# Patient Record
Sex: Male | Born: 1960 | Race: White | Hispanic: No | Marital: Married | Smoking: Never smoker
Health system: Southern US, Community
[De-identification: ages and names within clinical notes are randomized; demographics above are authoritative.]

## PROBLEM LIST (undated history)

## (undated) DIAGNOSIS — F259 Schizoaffective disorder, unspecified: Secondary | ICD-10-CM

## (undated) DIAGNOSIS — M19012 Primary osteoarthritis, left shoulder: Secondary | ICD-10-CM

## (undated) DIAGNOSIS — F258 Other schizoaffective disorders: Secondary | ICD-10-CM

## (undated) DIAGNOSIS — K863 Pseudocyst of pancreas: Secondary | ICD-10-CM

## (undated) DIAGNOSIS — I82409 Acute embolism and thrombosis of unspecified deep veins of unspecified lower extremity: Secondary | ICD-10-CM

## (undated) DIAGNOSIS — E785 Hyperlipidemia, unspecified: Secondary | ICD-10-CM

## (undated) DIAGNOSIS — K635 Polyp of colon: Secondary | ICD-10-CM

## (undated) DIAGNOSIS — R339 Retention of urine, unspecified: Secondary | ICD-10-CM

## (undated) DIAGNOSIS — I10 Essential (primary) hypertension: Secondary | ICD-10-CM

## (undated) DIAGNOSIS — N281 Cyst of kidney, acquired: Secondary | ICD-10-CM

## (undated) HISTORY — DX: Polyp of colon: K63.5

## (undated) HISTORY — PX: COLONOSCOPY: SHX174

## (undated) HISTORY — DX: Pseudocyst of pancreas: K86.3

## (undated) HISTORY — DX: Hyperlipidemia, unspecified: E78.5

---
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
## 1998-08-23 HISTORY — PX: LASIK: SHX215

## 2006-07-22 ENCOUNTER — Ambulatory Visit (HOSPITAL_COMMUNITY): Admission: RE | Admit: 2006-07-22 | Discharge: 2006-07-22 | Payer: Self-pay | Admitting: Family Medicine

## 2009-04-23 ENCOUNTER — Ambulatory Visit: Payer: Self-pay | Admitting: Psychiatry

## 2009-04-23 ENCOUNTER — Emergency Department (HOSPITAL_COMMUNITY): Admission: EM | Admit: 2009-04-23 | Discharge: 2009-04-23 | Payer: Self-pay | Admitting: Emergency Medicine

## 2009-04-23 ENCOUNTER — Inpatient Hospital Stay (HOSPITAL_COMMUNITY): Admission: AD | Admit: 2009-04-23 | Discharge: 2009-04-29 | Payer: Self-pay | Admitting: Psychiatry

## 2009-10-08 ENCOUNTER — Ambulatory Visit (HOSPITAL_COMMUNITY): Admission: RE | Admit: 2009-10-08 | Discharge: 2009-10-08 | Payer: Self-pay | Admitting: Gastroenterology

## 2010-09-13 ENCOUNTER — Encounter: Payer: Self-pay | Admitting: Family Medicine

## 2010-11-27 LAB — COMPREHENSIVE METABOLIC PANEL WITH GFR
ALT: 17 U/L (ref 0–53)
AST: 25 U/L (ref 0–37)
Albumin: 4.1 g/dL (ref 3.5–5.2)
Alkaline Phosphatase: 51 U/L (ref 39–117)
BUN: 19 mg/dL (ref 6–23)
CO2: 31 meq/L (ref 19–32)
Calcium: 9.2 mg/dL (ref 8.4–10.5)
Chloride: 101 meq/L (ref 96–112)
Creatinine, Ser: 1.27 mg/dL (ref 0.4–1.5)
GFR calc non Af Amer: >60 mL/min
Glucose, Bld: 100 mg/dL — ABNORMAL HIGH (ref 70–99)
Potassium: 3.7 meq/L (ref 3.5–5.1)
Sodium: 140 meq/L (ref 135–145)
Total Bilirubin: 1.2 mg/dL (ref 0.3–1.2)
Total Protein: 7.3 g/dL (ref 6.0–8.3)

## 2010-11-27 LAB — URINE MICROSCOPIC-ADD ON

## 2010-11-27 LAB — DIFFERENTIAL
Basophils Absolute: 0 K/uL (ref 0.0–0.1)
Basophils Relative: 1 % (ref 0–1)
Eosinophils Absolute: 0 K/uL (ref 0.0–0.7)
Eosinophils Relative: 0 % (ref 0–5)
Lymphocytes Relative: 20 % (ref 12–46)
Lymphs Abs: 1.2 K/uL (ref 0.7–4.0)
Monocytes Absolute: 0.5 K/uL (ref 0.1–1.0)
Monocytes Relative: 9 % (ref 3–12)
Neutro Abs: 4.3 K/uL (ref 1.7–7.7)
Neutrophils Relative %: 71 % (ref 43–77)

## 2010-11-27 LAB — URINALYSIS, ROUTINE W REFLEX MICROSCOPIC
Glucose, UA: NEGATIVE mg/dL
Hgb urine dipstick: NEGATIVE
Ketones, ur: 40 mg/dL — AB
Leukocytes, UA: NEGATIVE
Nitrite: NEGATIVE
Protein, ur: 30 mg/dL — AB
Specific Gravity, Urine: 1.03 (ref 1.005–1.030)
Urobilinogen, UA: 1 mg/dL (ref 0.0–1.0)
pH: 6 (ref 5.0–8.0)

## 2010-11-27 LAB — ETHANOL

## 2010-11-27 LAB — RAPID URINE DRUG SCREEN, HOSP PERFORMED
Amphetamines: NOT DETECTED
Barbiturates: NOT DETECTED
Benzodiazepines: NOT DETECTED
Cocaine: NOT DETECTED
Opiates: NOT DETECTED
Tetrahydrocannabinol: NOT DETECTED

## 2010-11-27 LAB — CBC
HCT: 46.7 % (ref 39.0–52.0)
Hemoglobin: 15.9 g/dL (ref 13.0–17.0)
MCHC: 34.1 g/dL (ref 30.0–36.0)
MCV: 92 fL (ref 78.0–100.0)
RDW: 13.1 % (ref 11.5–15.5)
WBC: 6.1 10*3/uL (ref 4.0–10.5)
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
## 2012-08-23 DIAGNOSIS — K863 Pseudocyst of pancreas: Secondary | ICD-10-CM

## 2012-08-23 HISTORY — DX: Pseudocyst of pancreas: K86.3

## 2012-10-23 ENCOUNTER — Emergency Department (HOSPITAL_COMMUNITY): Payer: Medicare Other

## 2012-10-23 ENCOUNTER — Inpatient Hospital Stay (HOSPITAL_COMMUNITY)
Admission: EM | Admit: 2012-10-23 | Discharge: 2012-10-30 | DRG: 439 | Disposition: A | Discharge status: Home / Self Care | Payer: Medicare Other | Attending: Family Medicine | Admitting: Family Medicine

## 2012-10-23 ENCOUNTER — Encounter (HOSPITAL_COMMUNITY): Payer: Self-pay | Admitting: Family Medicine

## 2012-10-23 DIAGNOSIS — K861 Other chronic pancreatitis: Secondary | ICD-10-CM | POA: Diagnosis present

## 2012-10-23 DIAGNOSIS — I959 Hypotension, unspecified: Secondary | ICD-10-CM

## 2012-10-23 DIAGNOSIS — R571 Hypovolemic shock: Secondary | ICD-10-CM

## 2012-10-23 DIAGNOSIS — E876 Hypokalemia: Secondary | ICD-10-CM | POA: Diagnosis present

## 2012-10-23 DIAGNOSIS — K859 Acute pancreatitis without necrosis or infection, unspecified: Secondary | ICD-10-CM

## 2012-10-23 DIAGNOSIS — N179 Acute kidney failure, unspecified: Secondary | ICD-10-CM

## 2012-10-23 DIAGNOSIS — K560 Paralytic ileus: Secondary | ICD-10-CM | POA: Diagnosis present

## 2012-10-23 DIAGNOSIS — I10 Essential (primary) hypertension: Secondary | ICD-10-CM | POA: Diagnosis present

## 2012-10-23 DIAGNOSIS — E871 Hypo-osmolality and hyponatremia: Secondary | ICD-10-CM

## 2012-10-23 DIAGNOSIS — K8050 Calculus of bile duct without cholangitis or cholecystitis without obstruction: Secondary | ICD-10-CM | POA: Diagnosis present

## 2012-10-23 DIAGNOSIS — K8020 Calculus of gallbladder without cholecystitis without obstruction: Secondary | ICD-10-CM

## 2012-10-23 DIAGNOSIS — R109 Unspecified abdominal pain: Secondary | ICD-10-CM

## 2012-10-23 DIAGNOSIS — F259 Schizoaffective disorder, unspecified: Secondary | ICD-10-CM | POA: Diagnosis present

## 2012-10-23 DIAGNOSIS — K819 Cholecystitis, unspecified: Secondary | ICD-10-CM

## 2012-10-23 HISTORY — DX: Schizoaffective disorder, unspecified: F25.9

## 2012-10-23 HISTORY — DX: Cyst of kidney, acquired: N28.1

## 2012-10-23 HISTORY — DX: Other schizoaffective disorders: F25.8

## 2012-10-23 LAB — URINE MICROSCOPIC-ADD ON

## 2012-10-23 LAB — COMPREHENSIVE METABOLIC PANEL
ALT: 100 U/L — ABNORMAL HIGH (ref 0–53)
AST: 74 U/L — ABNORMAL HIGH (ref 0–37)
Albumin: 3.3 g/dL — ABNORMAL LOW (ref 3.5–5.2)
Chloride: 85 mEq/L — ABNORMAL LOW (ref 96–112)
GFR calc non Af Amer: 18 mL/min — ABNORMAL LOW (ref >90)
Potassium: 4.5 mEq/L (ref 3.5–5.1)
Sodium: 126 mEq/L — ABNORMAL LOW (ref 135–145)
Total Bilirubin: 1.2 mg/dL (ref 0.3–1.2)

## 2012-10-23 LAB — URINALYSIS, ROUTINE W REFLEX MICROSCOPIC
Ketones, ur: NEGATIVE mg/dL
Protein, ur: 100 mg/dL — AB
Urobilinogen, UA: 0.2 mg/dL (ref 0.0–1.0)

## 2012-10-23 LAB — CBC
HCT: 37.4 % — ABNORMAL LOW (ref 39.0–52.0)
MCHC: 34.8 g/dL (ref 30.0–36.0)
Platelets: 203 10*3/uL (ref 150–400)
RDW: 13.2 % (ref 11.5–15.5)
WBC: 13 10*3/uL — ABNORMAL HIGH (ref 4.0–10.5)

## 2012-10-23 LAB — POCT I-STAT 3, ART BLOOD GAS (G3+)
Acid-base deficit: 2 mmol/L (ref 0.0–2.0)
O2 Saturation: 93 %
TCO2: 24 mmol/L (ref 0–100)
pO2, Arterial: 68 mmHg — ABNORMAL LOW (ref 80.0–100.0)

## 2012-10-23 LAB — BASIC METABOLIC PANEL
BUN: 49 mg/dL — ABNORMAL HIGH (ref 6–23)
Calcium: 7.4 mg/dL — ABNORMAL LOW (ref 8.4–10.5)
Creatinine, Ser: 2.32 mg/dL — ABNORMAL HIGH (ref 0.50–1.35)
GFR calc Af Amer: 35 mL/min — ABNORMAL LOW (ref >90)
GFR calc non Af Amer: 30 mL/min — ABNORMAL LOW (ref >90)

## 2012-10-23 LAB — CBC WITH DIFFERENTIAL/PLATELET
Basophils Absolute: 0 10*3/uL (ref 0.0–0.1)
Basophils Relative: 0 % (ref 0–1)
HCT: 44.8 % (ref 39.0–52.0)
Lymphocytes Relative: 3 % — ABNORMAL LOW (ref 12–46)
MCV: 87.8 fL (ref 78.0–100.0)
Monocytes Relative: 10 % (ref 3–12)
Neutro Abs: 21.3 10*3/uL — ABNORMAL HIGH (ref 1.7–7.7)
Neutrophils Relative %: 87 % — ABNORMAL HIGH (ref 43–77)
Platelets: 268 10*3/uL (ref 150–400)
RDW: 13.1 % (ref 11.5–15.5)
WBC: 24.5 10*3/uL — ABNORMAL HIGH (ref 4.0–10.5)

## 2012-10-23 LAB — POCT I-STAT 3, VENOUS BLOOD GAS (G3P V)
Acid-base deficit: 1 mmol/L (ref 0.0–2.0)
Bicarbonate: 22.9 mEq/L (ref 20.0–24.0)
O2 Saturation: 99 %
Patient temperature: 97.3
TCO2: 24 mmol/L (ref 0–100)
pH, Ven: 7.411 — ABNORMAL HIGH (ref 7.250–7.300)

## 2012-10-23 LAB — CG4 I-STAT (LACTIC ACID): Lactic Acid, Venous: 5.26 mmol/L — ABNORMAL HIGH (ref 0.5–2.2)

## 2012-10-23 MED ORDER — HEPARIN SODIUM (PORCINE) 5000 UNIT/ML IJ SOLN
5000.0000 [IU] | Freq: Three times a day (TID) | INTRAMUSCULAR | Status: DC
  Administered 2012-10-23 – 2012-10-28 (×13): 5000 [IU] via SUBCUTANEOUS
  Filled 2012-10-23 (×23): qty 1

## 2012-10-23 MED ORDER — SODIUM CHLORIDE 0.9 % IJ SOLN
3.0000 mL | Freq: Two times a day (BID) | INTRAMUSCULAR | Status: DC
  Administered 2012-10-23 – 2012-10-30 (×4): 3 mL via INTRAVENOUS

## 2012-10-23 MED ORDER — QUETIAPINE FUMARATE 200 MG PO TABS
400.0000 mg | ORAL_TABLET | Freq: Every evening | ORAL | Status: DC | PRN
  Administered 2012-10-23 – 2012-10-29 (×5): 400 mg via ORAL
  Filled 2012-10-23 (×2): qty 2
  Filled 2012-10-23 (×3): qty 1
  Filled 2012-10-23: qty 2

## 2012-10-23 MED ORDER — ONDANSETRON HCL 4 MG/2ML IJ SOLN
4.0000 mg | Freq: Once | INTRAMUSCULAR | Status: AC
  Administered 2012-10-23: 4 mg via INTRAVENOUS
  Filled 2012-10-23: qty 2

## 2012-10-23 MED ORDER — ONDANSETRON HCL 4 MG PO TABS: 4.0000 mg | ORAL_TABLET | Freq: Four times a day (QID) | ORAL | Status: DC | PRN

## 2012-10-23 MED ORDER — SODIUM CHLORIDE 0.9 % IV SOLN: 3.0000 g | Freq: Once | INTRAVENOUS | Status: DC

## 2012-10-23 MED ORDER — KCL IN DEXTROSE-NACL 10-5-0.45 MEQ/L-%-% IV SOLN
INTRAVENOUS | Status: DC
  Administered 2012-10-23 – 2012-10-24 (×2): via INTRAVENOUS
  Filled 2012-10-23 (×5): qty 1000

## 2012-10-23 MED ORDER — MORPHINE SULFATE 4 MG/ML IJ SOLN
4.0000 mg | Freq: Once | INTRAMUSCULAR | Status: AC
  Administered 2012-10-23: 4 mg via INTRAVENOUS
  Filled 2012-10-23: qty 1

## 2012-10-23 MED ORDER — SODIUM CHLORIDE 0.9 % IV SOLN
3.0000 g | Freq: Two times a day (BID) | INTRAVENOUS | Status: DC
  Administered 2012-10-24: 3 g via INTRAVENOUS
  Filled 2012-10-23 (×2): qty 3

## 2012-10-23 MED ORDER — ONDANSETRON HCL 4 MG/2ML IJ SOLN: 4.0000 mg | Freq: Four times a day (QID) | INTRAMUSCULAR | Status: DC | PRN

## 2012-10-23 MED ORDER — QUETIAPINE FUMARATE 400 MG PO TABS
400.0000 mg | ORAL_TABLET | Freq: Every day | ORAL | Status: DC
  Administered 2012-10-23 – 2012-10-29 (×7): 400 mg via ORAL
  Filled 2012-10-23 (×9): qty 1

## 2012-10-23 MED ORDER — QUETIAPINE FUMARATE 400 MG PO TABS: 400.0000 mg | ORAL_TABLET | Freq: Every day | ORAL | Status: DC

## 2012-10-23 MED ORDER — SODIUM CHLORIDE 0.9 % IV SOLN
3.0000 g | Freq: Once | INTRAVENOUS | Status: AC
  Administered 2012-10-23: 3 g via INTRAVENOUS
  Filled 2012-10-23: qty 3

## 2012-10-23 MED ORDER — PANTOPRAZOLE SODIUM 40 MG IV SOLR
40.0000 mg | INTRAVENOUS | Status: DC
  Administered 2012-10-23 – 2012-10-26 (×4): 40 mg via INTRAVENOUS
  Filled 2012-10-23 (×6): qty 40

## 2012-10-23 MED ORDER — SODIUM CHLORIDE 0.9 % IV SOLN
Freq: Once | INTRAVENOUS | Status: AC
  Administered 2012-10-23: 12:00:00 via INTRAVENOUS

## 2012-10-23 MED ORDER — SODIUM CHLORIDE 0.9 % IV BOLUS (SEPSIS)
1000.0000 mL | Freq: Once | INTRAVENOUS | Status: AC
  Administered 2012-10-23: 1000 mL via INTRAVENOUS

## 2012-10-23 MED ORDER — HYDROMORPHONE HCL PF 1 MG/ML IJ SOLN
1.0000 mg | INTRAMUSCULAR | Status: DC | PRN
  Administered 2012-10-23 – 2012-10-26 (×8): 1 mg via INTRAVENOUS
  Filled 2012-10-23 (×7): qty 1

## 2012-10-23 MED ORDER — IOHEXOL 300 MG/ML  SOLN
25.0000 mL | INTRAMUSCULAR | Status: AC
  Administered 2012-10-23 (×2): 25 mL via ORAL

## 2012-10-23 NOTE — Progress Notes (Signed)
PCP updated in EPIC to [facility] as listed in consult note

## 2012-10-23 NOTE — H&P (Signed)
History and Physical Note Family Medicine Teaching Service Steve M. Hairford, MD Service Pager: [phone]  Steve Levy is an 52 y.o. male.    Chief Complaint: Abdominal pain HPI: Pt is a 52 yo healthy M with a PMH of schizoaffective disorder and renal cysts presenting to ED with a 4 day history of RUQ abd pain, N/V and anorexia. He states he feels like he had a viral infection with N/V but the pain was persistent so he came to ED for evaluation today. Pt states he was diagnosed with cholecystitis 15 years ago but elected to not have gallbladder removed at that time, and he has intermittent RUQ pain since then, but today's episode was worse and he was no longer passing gas or able to have a bowel movement. At initial evaluation in the ED he was hypotensive to the 80's and tachycardic to 120's. His initial labs showed a lactic acidosis to 5.26 and leukocytosis to 24.5. Lipase elevated to 204, as well as AKI with Creat of 3.5. Plain abd X-ray was suggestive of partial SBO.  He subsequently had an abdominal ultrasound that showed sludge in the gall bladder therefore surgery was consulted who felt this could be managed medically until pancreatitis improves. Patient was hydrated and treated with pain medication and anti-emetics, and lactate improved to 2. FPTS called for admission  Past Medical History  Diagnosis Date  . Chronic schizoaffective disorder   . Renal cyst     No past surgical history on file.  No family history on file. Social History:  has no tobacco, alcohol, and drug history on file.  Allergies: No Known Allergies  Prior to Admission medications   Medication Sig Start Date End Date Taking? Authorizing Provider  lisinopril (PRINIVIL,ZESTRIL) 10 MG tablet Take 10 mg by mouth daily.   Yes Historical Provider, MD  QUEtiapine (SEROQUEL) 400 MG tablet Take 400-800 mg by mouth at bedtime.   Yes Historical Provider, MD    Results for orders placed during the hospital encounter of  10/23/12 (from the past 48 hour(s))  COMPREHENSIVE METABOLIC PANEL     Status: Abnormal   Collection Time    10/23/12 10:03 AM      Result Value Range   Sodium 126 (*) 135 - 145 mEq/L   Potassium 4.5  3.5 - 5.1 mEq/L   Chloride 85 (*) 96 - 112 mEq/L   CO2 19  19 - 32 mEq/L   Glucose, Bld 140 (*) 70 - 99 mg/dL   BUN 56 (*) 6 - 23 mg/dL   Creatinine, Ser 2.95 (*) 0.50 - 1.35 mg/dL   Calcium 9.1  8.4 - 62.1 mg/dL   Total Protein 8.0  6.0 - 8.3 g/dL   Albumin 3.3 (*) 3.5 - 5.2 g/dL   AST 74 (*) 0 - 37 U/L   ALT 100 (*) 0 - 53 U/L   Alkaline Phosphatase 99  39 - 117 U/L   Total Bilirubin 1.2  0.3 - 1.2 mg/dL   GFR calc non Af Amer 18 (*) >90 mL/min   GFR calc Af Amer 20 (*) >90 mL/min   Comment:            The eGFR has been calculated     using the CKD EPI equation.     This calculation has not been     validated in all clinical     situations.     eGFR's persistently     <90 mL/min signify  possible Chronic Kidney Disease.  LIPASE, BLOOD     Status: Abnormal   Collection Time    10/23/12 10:03 AM      Result Value Range   Lipase 205 (*) 11 - 59 U/L  CBC WITH DIFFERENTIAL     Status: Abnormal   Collection Time    10/23/12 10:03 AM      Result Value Range   WBC 24.5 (*) 4.0 - 10.5 K/uL   RBC 5.10  4.22 - 5.81 MIL/uL   Hemoglobin 15.3  13.0 - 17.0 g/dL   HCT 40.9  81.1 - 91.4 %   MCV 87.8  78.0 - 100.0 fL   MCH 30.0  26.0 - 34.0 pg   MCHC 34.2  30.0 - 36.0 g/dL   RDW 78.2  95.6 - 21.3 %   Platelets 268  150 - 400 K/uL   Neutrophils Relative 87 (*) 43 - 77 %   Neutro Abs 21.3 (*) 1.7 - 7.7 K/uL   Lymphocytes Relative 3 (*) 12 - 46 %   Lymphs Abs 0.7  0.7 - 4.0 K/uL   Monocytes Relative 10  3 - 12 %   Monocytes Absolute 2.5 (*) 0.1 - 1.0 K/uL   Eosinophils Relative 0  0 - 5 %   Eosinophils Absolute 0.0  0.0 - 0.7 K/uL   Basophils Relative 0  0 - 1 %   Basophils Absolute 0.0  0.0 - 0.1 K/uL  CG4 I-STAT (LACTIC ACID)     Status: Abnormal   Collection Time     10/23/12 10:29 AM      Result Value Range   Lactic Acid, Venous 5.26 (*) 0.5 - 2.2 mmol/L  POCT I-STAT 3, BLOOD GAS (G3+)     Status: Abnormal   Collection Time    10/23/12 12:24 PM      Result Value Range   pH, Arterial 7.395  7.350 - 7.450   pCO2 arterial 37.1  35.0 - 45.0 mmHg   pO2, Arterial 68.0 (*) 80.0 - 100.0 mmHg   Bicarbonate 22.7  20.0 - 24.0 mEq/L   TCO2 24  0 - 100 mmol/L   O2 Saturation 93.0     Acid-base deficit 2.0  0.0 - 2.0 mmol/L   Patient temperature 98.6 F     Collection site RADIAL, ALLEN'S TEST ACCEPTABLE     Drawn by Operator     Sample type ARTERIAL    CG4 I-STAT (LACTIC ACID)     Status: None   Collection Time    10/23/12  1:11 PM      Result Value Range   Lactic Acid, Venous 2.02  0.5 - 2.2 mmol/L  LACTATE DEHYDROGENASE     Status: Abnormal   Collection Time    10/23/12  2:42 PM      Result Value Range   LDH 547 (*) 94 - 250 U/L  URINALYSIS, ROUTINE W REFLEX MICROSCOPIC     Status: Abnormal   Collection Time    10/23/12  3:14 PM      Result Value Range   Color, Urine YELLOW  YELLOW   APPearance HAZY (*) CLEAR   Specific Gravity, Urine 1.022  1.005 - 1.030   pH 5.5  5.0 - 8.0   Glucose, UA 100 (*) NEGATIVE mg/dL   Hgb urine dipstick LARGE (*) NEGATIVE   Bilirubin Urine NEGATIVE  NEGATIVE   Ketones, ur NEGATIVE  NEGATIVE mg/dL   Protein, ur 086 (*) NEGATIVE mg/dL   Urobilinogen, UA  0.2  0.0 - 1.0 mg/dL   Nitrite NEGATIVE  NEGATIVE   Leukocytes, UA NEGATIVE  NEGATIVE  URINE MICROSCOPIC-ADD ON     Status: Abnormal   Collection Time    10/23/12  3:14 PM      Result Value Range   Squamous Epithelial / LPF RARE  RARE   WBC, UA 0-2  <3 WBC/hpf   Bacteria, UA FEW (*) RARE   Casts HYALINE CASTS (*) NEGATIVE   Comment: GRANULAR CAST   Urine-Other MUCOUS PRESENT    POCT I-STAT 3, BLOOD GAS (G3P V)     Status: Abnormal   Collection Time    10/23/12  5:16 PM      Result Value Range   pH, Ven 7.411 (*) 7.250 - 7.300   pCO2, Ven 35.8 (*) 45.0 -  50.0 mmHg   pO2, Ven 149.0 (*) 30.0 - 45.0 mmHg   Bicarbonate 22.9  20.0 - 24.0 mEq/L   TCO2 24  0 - 100 mmol/L   O2 Saturation 99.0     Acid-base deficit 1.0  0.0 - 2.0 mmol/L   Patient temperature 97.3 F     Sample type VENOUS     Ct Abdomen Pelvis Wo Contrast  10/23/2012  *RADIOLOGY REPORT*  Clinical Data: Abdominal pain.  CT ABDOMEN AND PELVIS WITHOUT CONTRAST  Technique:  Multidetector CT imaging of the abdomen and pelvis was performed following the standard protocol without intravenous contrast.  Comparison: Abdominal ultrasound 10/24/2011 the CT abdomen pelvis 07/22/2006  Findings: Bibasilar airspace disease is worse on the left.  Small left pleural effusion is present.  A small pericardial effusion is noted. The heart size is normal.  An 11 mm cyst near the dome of the liver is stable.  A smaller cyst in the left lobe is also stable.  The liver is otherwise unremarkable.  The spleen is within normal limits.  The stomach is unremarkable.  The duodenum is within normal limits.  Extensive inflammatory changes and fluid are noted about the tail the pancreas.  Extensive free fluid is present in the abdomen.  There is no significant free air.  The gallbladder is contracted.  The common bile duct is within normal limits.  The adrenal glands are normal bilaterally.  The right kidney and ureter are within normal limits. A subtle hypodense lesion is present anteriorly in the left kidney with calcification along the inferior aspect of the lesion. This corresponds to the previously noted cyst.  No other definite renal lesions are present.  There is some inflammatory change about the left kidney.  The ureter is within normal limits.  Extensive inflammatory change in free fluid is noted along the pararenal fascia.  No discrete abscess or pseudocyst is definitively identified. Pseudocysts are suspected.  The study is limited without IV contrast.  Contrast was not given secondary to acute renal failure.  There is  diffuse distention of the small bowel with secondary inflammatory changes occurring in the proximal jejunum.  Secondary inflammatory changes are also present in the descending colon and splenic flexure.  There is fluid in the ascending colon.  The appendix is visualized and within normal limits.  The urinary bladder and prostate are within normal limits.  The bone windows demonstrate no focal lytic or blastic lesions.  IMPRESSION:  1.  Acute pancreatitis with extensive retroperitoneal fluid and inflammatory change.  Pseudocyst are suspected but not clearly characterized without IV contrast. 2.  Secondary inflammation of the proximal jejunum and descending colon resulting in  a diffuse ileus. 3.  Left greater than right basilar airspace disease.  While this may represent atelectasis, infection is not excluded. 4.  Small left pleural effusion. 5.  Small pericardial effusion. 6.  Cystic lesions of the liver are stable. 7.  Stable cystic lesion anteriorly in the left kidney.   Original Report Authenticated By: Marin Roberts, M.D.    US Abdomen Complete  10/23/2012  *RADIOLOGY REPORT*  Clinical Data:  Abdominal pain.  ABDOMEN ULTRASOUND  Technique:  Complete abdominal ultrasound examination was performed including evaluation of the liver, gallbladder, bile ducts, pancreas, kidneys, spleen, IVC, and abdominal aorta.  Comparison:  CT on 07/22/2006.  Findings:  Gallbladder:  The gallbladder appears contracted and likely contains some sludge as well as probable shadowing calculi.  No sonographic Murphy's sign was elicited.  There is no evidence of pericholecystic fluid.  Mild prominence of gallbladder wall thickness is likely related to contraction.  Common Bile Duct:  Normal caliber of 4 mm.  Liver:  The liver shows heterogeneous echotexture and no evidence of mass or biliary ductal dilatation.  IVC:  Patent throughout its visualized course in the abdomen.  Pancreas:  The pancreas is completely obscured by bowel gas.   Spleen:  The spleen is of normal echotexture and size.  Kidneys:  The right kidney measures approximately 10.4 cm and the left kidney 11.6 cm.  2.1 cm left renal cyst identified which shows suggestion of mild internal septation.  This cyst is stable in size compared to the prior CT in 2007.  No evidence of hydronephrosis.  Abdominal Aorta:  The abdominal aorta is completely obscured by bowel gas.  IMPRESSION: Contracted gallbladder likely containing sludge and some calculi. There is no evidence of biliary obstruction.   Original Report Authenticated By: Irish Lack, M.D.    Dg Abd Acute W/chest  10/23/2012  *RADIOLOGY REPORT*  Clinical Data: Abdominal pain  ACUTE ABDOMEN SERIES (ABDOMEN 2 VIEW & CHEST 1 VIEW)  Comparison: 07/22/2006  Findings: There is no free air.  There are abnormal dilated fluid and air filled loops of small intestine suggesting partial small bowel obstruction.  No abnormal calcifications or bony findings.  One-view chest shows normal heart and mediastinal shadows.  There is mild atelectasis at the left base.  No free air.  IMPRESSION: Abnormal fluid and air filled loops of small intestine in the central abdomen suggesting partial small bowel obstruction.   Original Report Authenticated By: Paulina Fusi, M.D.     Review of Systems  Constitutional: Positive for malaise/fatigue. Negative for fever and chills.  Respiratory: Negative for shortness of breath.   Cardiovascular: Negative for chest pain.  Gastrointestinal: Positive for nausea, vomiting and abdominal pain. Negative for diarrhea and constipation.  Genitourinary: Negative for dysuria.  Musculoskeletal: Negative for back pain.  Skin: Negative for rash.  Neurological: Negative for headaches.  Psychiatric/Behavioral: Negative for depression and suicidal ideas. The patient is not nervous/anxious.     Blood pressure 135/78, pulse 100, temperature 97.3 F (36.3 C), resp. rate 18, SpO2 9.00%. Physical Exam  Constitutional:  He is oriented to person, place, and time. He appears well-developed and well-nourished. No distress.  HENT:  Head: Normocephalic and atraumatic.  Eyes: Pupils are equal, round, and reactive to light.  Neck: Normal range of motion. Neck supple.  Cardiovascular: Regular rhythm and intact distal pulses.   No murmur heard. Tachycardic  Respiratory: Effort normal and breath sounds normal. He has no wheezes.  GI: Normal appearance. He exhibits distension. He exhibits no  mass. Bowel sounds are decreased. There is tenderness in the right upper quadrant and epigastric area. There is guarding and positive Murphy's sign. There is no CVA tenderness.  Musculoskeletal: Normal range of motion. He exhibits no edema and no tenderness.  Lymphadenopathy:    He has no cervical adenopathy.  Neurological: He is alert and oriented to person, place, and time.  Skin: Skin is warm and dry. No rash noted. He is not diaphoretic.  Psychiatric: He has a normal mood and affect. His behavior is normal.     Assessment/Plan 52 yo M with abdominal pain, N/V who was found to have pancreatitis, cholecystitis, AKI and small bowel ileus   # Pancreatitis- Patient with recurrent pancreatitis. Lipase 205 at admission. Pt denies diabetes, alcohol, prior surgery, abdominal trauma or any new medications/drug use. Could be secondary to biliary source. - Admit to telemetry, attending Dr. Leveda Anna - Keep NPO - Place NGT for distention, vomiting and pain. If not tolerated well, can consider d/c - Continue fluids. He had 3L in the ED, continue D51/2NS + Kcl at 150 cc/hr. Closely monitor I/O and BP, and consider additional NS bolus if needed - Recheck Bmet this evening, and Cmet in the AM - Dilaudid as needed for pain - Zofran for nausea - CT scan has been completed now which also shows pancreatitis, with possible pseudocyst - Surgery consulted in ED, and will follow along - Continue Unasyn for now given the extensive amount of  inflammation and possible ascending cholangitis in the differential. Will d/c in 48-72 hours if patient improves clinically  # AKI- Pt states he is not aware of every having renal failure in the past, but does have a history of renal cysts which was seen on CT scan today - Cr 3.5 at admission - Continue to hydrate - Bmet this evening and repeat labs in the AM - Parameters for I/O placed. Please call if decreased UOP  # Small bowel ileus- differential includes SBO, but given pancreatitis, ileus is more likely - Continue NGT as tolerated - Surgery following - Monitor for passing gas or bowel movements  # HTN- On Lisinopril at home. Hypotensive on arrival, now improved with fluids - Hold Lisinopril tonight, but restart tomorrow if BP remains stable  # Schizoaffective disorder- Stable at this time. Has been hospitalized in the past - Continue Seroquel qhs - Mood and behavior appropriate  FEN/GI- NPO, IVF per above PPX- Protonix IV, Heparin, out of bed as tolerated Dispo- Pending improvement of clinical symptoms as well as surgical evaluation for possible cholecystectomy. Patient will need to be tolerating PO, have his pain controlled and be cleared by surgery before discharge.    Levy, Steve 10/23/2012, 5:43 PM

## 2012-10-23 NOTE — ED Provider Notes (Addendum)
History     CSN: [id number]  Arrival date & time 10/23/12  2130   First MD Initiated Contact with Patient 10/23/12 [phone]      No chief complaint on file.   (Consider location/radiation/quality/duration/timing/severity/associated sxs/prior treatment) HPI Comments: 52 yo male with history of biliary sludge and pancreatitis 15 years prior to presenting to the ED today with epigastric/RUQ abdominal pain, nausea, and vomiting.  Symptoms began Friday (10/20/12) suddenly around 8 am after breakfast.  Since then has had persistent sharp pain in epigastric and RUQ that radiates to the back, nausea, and vomiting.  Saw PCP Friday afternoon and was prescribed hydrocodone and promethazine suppository.  These relieve the symptoms temporarily.  Last dose was this morning at 9am and reduced pain to a 7/10 from a 10/10.  Lying on his belly makes the pain worse.  He reports having no BM or passing gas since Thursday, but has noticed a lot of belching.  Denies fever, chills, hematemesis, hematochezia, melena, urinary changes, chest pain, or shortness of breath.    The history is provided by the patient.    No past medical history on file.  No past surgical history on file.  No family history on file.  History  Substance Use Topics  . Smoking status: Not on file  . Smokeless tobacco: Not on file  . Alcohol Use: Not on file      Review of Systems  Constitutional: Positive for fatigue. Negative for fever, chills and activity change.  HENT: Negative for neck pain.   Eyes: Negative for visual disturbance.  Respiratory: Negative for cough, chest tightness and shortness of breath.   Cardiovascular: Negative for chest pain.  Gastrointestinal: Positive for nausea, vomiting, abdominal pain, constipation and abdominal distention.  Genitourinary: Negative for dysuria, enuresis and difficulty urinating.  Musculoskeletal: Negative for arthralgias.  Neurological: Positive for dizziness and light-headedness.  Negative for syncope and headaches.  Psychiatric/Behavioral: Negative for confusion.    Allergies  Review of patient's allergies indicates no known allergies.  Home Medications   Current Outpatient Rx  Name  Route  Sig  Dispense  Refill  . lisinopril (PRINIVIL,ZESTRIL) 10 MG tablet   Oral   Take 10 mg by mouth daily.         . QUEtiapine (SEROQUEL) 400 MG tablet   Oral   Take 400-800 mg by mouth at bedtime.           BP 85/35  Pulse 126  Temp(Src) 97.3 F (36.3 C)  Resp 16  SpO2 100%  Physical Exam  Nursing note and vitals reviewed. Constitutional: He is oriented to person, place, and time. He appears well-developed.  HENT:  Head: Normocephalic and atraumatic.  Eyes: Conjunctivae and EOM are normal. Pupils are equal, round, and reactive to light.  Neck: Normal range of motion. Neck supple.  Cardiovascular: Normal rate, regular rhythm and normal heart sounds.   Pulmonary/Chest: Effort normal and breath sounds normal. No respiratory distress. He has no wheezes.  Abdominal: Soft. Bowel sounds are normal. He exhibits distension. There is tenderness. There is guarding. There is no rebound.  Epigastric and RUQ tenderness, with some epigastric guarding but no rebound  Neurological: He is alert and oriented to person, place, and time.  Skin: Skin is warm.    ED Course  Procedures (including critical care time)  Labs Reviewed  CBC WITH DIFFERENTIAL - Abnormal; Notable for the following:    WBC 24.5 (*)    Neutrophils Relative 87 (*)    Neutro  Abs 21.3 (*)    Lymphocytes Relative 3 (*)    Monocytes Absolute 2.5 (*)    All other components within normal limits  CG4 I-STAT (LACTIC ACID) - Abnormal; Notable for the following:    Lactic Acid, Venous 5.26 (*)    All other components within normal limits  COMPREHENSIVE METABOLIC PANEL  LIPASE, BLOOD  URINALYSIS, ROUTINE W REFLEX MICROSCOPIC   No results found.   No diagnosis found.    MDM  DDx  includes: Pancreatitis Hepatobiliary pathology including cholecystitis Gastritis/PUD SBO ACS syndrome Aortic Dissection Perforated viscus  Pt comes in with cc of abd pain. Has remote hx of gall stone pancreatitis. Pt has been having RUQ and epigastric pain for few days now, not improbimng, so he decided to come in to the ER. Pt is tachycardic, and has leukocytosis. BP is low in the 80s and 90s. We had ordered lactate and it is 5.26, Will start agressibe IV fluids. Etiology - sepsis vs. Hypovolemia and starvation(pt admits to barely any po intake since Thursday).  DDx is as above. Will get basic labs.  AAS ordered to r/o free air and SBO. We also want to get an [place]. If both of the above are negative - CT will be warranted - or if there is free air, CT might be warranted.  Holding off antibtiotics at this time, and discussed possible central line placement with patient if septic.    Date: 10/23/2012  Rate: 115  Rhythm: normal sinus rhythm  QRS Axis: normal  Intervals: normal  ST/T Wave abnormalities: normal  Conduction Disutrbances: none  Narrative Interpretation: unremarkable   12:34 PM Pt has partial SBO per xray. Lipase elevation, [place] is equivocal. Has AKI. Repeat lactate ordered. Surgery to see the patient, but they want medicine admission at this time, as there is no acute surgical issue. They will evaluate for possible CT as well. I will give a dose of antibiotics.  3:32 PM Pt responded to aggressive resuscitation - and the lactate has cleared. Will call for admission. Surgery recs are already in. Awaiting CT abd. RANSOM SCORE IS 2  CRITICAL CARE Performed by: Derwood Kaplan   Total critical care time: 45 minutes  Critical care time was exclusive of separately billable procedures and treating other patients.  Critical care was necessary to treat or prevent imminent or life-threatening deterioration.  Critical care was time spent personally by me on the  following activities: development of treatment plan with patient and/or surrogate as well as nursing, discussions with consultants, evaluation of patient's response to treatment, examination of patient, obtaining history from patient or surrogate, ordering and performing treatments and interventions, ordering and review of laboratory studies, ordering and review of radiographic studies, pulse oximetry and re-evaluation of patient's condition.    Derwood Kaplan, MD 10/23/12 1533  Derwood Kaplan, MD 10/23/12 1545

## 2012-10-23 NOTE — Progress Notes (Signed)
ANTIBIOTIC CONSULT NOTE - INITIAL  Pharmacy Consult for unasyn Indication: pancreatitis, cholecystitis  No Known Allergies   Vital Signs: Temp: 98.7 F (37.1 C) (03/03 1801) Temp src: Oral (03/03 1801) BP: 100/53 mmHg (03/03 1801) Pulse Rate: 113 (03/03 1801) Intake/Output from previous day:   Intake/Output from this shift:    Labs:  Recent Labs  10/23/12 1003  WBC 24.5*  HGB 15.3  PLT 268  CREATININE 3.65*   CrCl is unknown because there is no height on file for the current visit. No results found for this basename: VANCOTROUGH, VANCOPEAK, VANCORANDOM, GENTTROUGH, GENTPEAK, GENTRANDOM, TOBRATROUGH, TOBRAPEAK, TOBRARND, AMIKACINPEAK, AMIKACINTROU, AMIKACIN,  in the last 72 hours   Microbiology: No results found for this or any previous visit (from the past 720 hour(s)).  Medical History: Past Medical History  Diagnosis Date  . Chronic schizoaffective disorder   . Renal cyst     Medications:  See med rec   Assessment: Patient is a 52 y.o M presented to the ED with c/o n/v and RUQ pain and found to have pancreatitis.  Abd Xray with suspected partial small bowel obstruction.  To start unasyn for empiric coverage. Scr 3.65 (crcl ~24).  Patient received unasyn 3gm x1 in ED at ~1400.   Plan:  1) unasyn 3gm IV q12h  Pham, Anh P 10/23/2012,7:34 PM

## 2012-10-23 NOTE — ED Notes (Signed)
Lactic acid results given to Dr. Nanavati 

## 2012-10-23 NOTE — ED Notes (Signed)
Dr Rhunette Croft aware of latic acid and wbc 2 nd IV 18 in rt ac started IV fluids at 999. To xray

## 2012-10-23 NOTE — ED Notes (Signed)
Spoke with Rodman Pickle, MD and reported that pt.s BP was 100/53, HR 113, after receiving 2nd bolus of Normal Saline.  Order received to give pt. A bolus and then reassess pt.s BP and report to Dr. Mikel Cella.

## 2012-10-23 NOTE — ED Notes (Signed)
States pain is in middle of abd has had pancreatitis before only after his gall bladder got back up

## 2012-10-23 NOTE — Consult Note (Signed)
Reason for Consult: Pancreatitis with acute renal failure. Referring Physician: DR. Derwood Kaplan (ER-MD) PCP: Dr. Nathanial Rancher, [place], [place]. PSYC:  Dr. Betti Cruz GI:  Dr.Schooler    Steve Levy is an 52 y.o. male.  HPI: Patient is a relatively healthy 52 year old he reports he started feeling bad on Friday last week. He was abdominal pain nausea and and it was their opinion that this was probably viral. He's continued to have ongoing nausea and vomiting. The vomiting has stopped over the last 24 hours, but he continues to be unable to eat or drink anything at this time. He presented to the emergency room this morning with extremely abnormal studies. Sodium 126. BUN 56, creatinine 3.65,lipase 205. Lactic acid 5.26.  WBC is 24.5. H./H.= 15.3/44.8.  Abdominal ultrasound shows the gallbladder is contracted, and to have some sludge and probable calculi. Negative sonographic Murphy's sign, no pericholecystic fluid, a question of some gallbladder wall thickening, GB was contracted.. Common bile duct was normal at 4mm. the kidneys werenormal 10.4 cm on the right and 11.6 cm on the left at 2.1 cm cyst stable compared to a prior CT in 2007, no hydronephrosis. Three-way abdomen shows some atelectasis on his chest x-ray left base there is abnormally dilated fluid and air in the small bowel specimen partial small bowel obstruction. He has no prior history of abdominal surgery. We are asked to see in consultation for gallstone pancreatitis. He reports an episode of some 15 years ago. Since that time he's had some intermittent which lasted approximately 2 hours. His symptoms have been ongoing since Friday, 10/20/12.  PMH:  1. Hypertension 2. History of schizoaffective disorder. Multiple hospitalizations in the past, none for 4 years. He is compliant with his medication.  Past surgical history:  None  normal colonoscopy 2011 Family history: father's deceased with a ruptured aortic aneurysm,  mother is living  in good health she has had her gallbladder removed.  One brother living and in good health to  Social History:  Tobacco: None.  Alcohol: Rare.  Drugs: None He is on disability for his schizoaffective disorder. 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  Allergies: No Known Allergies  Medications:  Prior to Admission medications   Medication Sig Start Date End Date Taking? Authorizing Provider  lisinopril (PRINIVIL,ZESTRIL) 10 MG tablet Take 10 mg by mouth daily.   Yes Historical Provider, MD  QUEtiapine (SEROQUEL) 400 MG tablet Take 400-800 mg by mouth at bedtime.   Yes Historical Provider, MD     Results for orders placed during the hospital encounter of 10/23/12 (from the past 48 hour(s))  COMPREHENSIVE METABOLIC PANEL     Status: Abnormal   Collection Time    10/23/12 10:03 AM      Result Value Range   Sodium 126 (*) 135 - 145 mEq/L   Potassium 4.5  3.5 - 5.1 mEq/L   Chloride 85 (*) 96 - 112 mEq/L   CO2 19  19 - 32 mEq/L   Glucose, Bld 140 (*) 70 - 99 mg/dL   BUN 56 (*) 6 - 23 mg/dL   Creatinine, Ser 4.78 (*) 0.50 - 1.35 mg/dL   Calcium 9.1  8.4 - 29.5 mg/dL   Total Protein 8.0  6.0 - 8.3 g/dL   Albumin 3.3 (*) 3.5 - 5.2 g/dL   AST 74 (*) 0 - 37 U/L   ALT 100 (*) 0 - 53 U/L   Alkaline Phosphatase 99  39 - 117 U/L  Total Bilirubin 1.2  0.3 - 1.2 mg/dL   GFR calc non Af Amer 18 (*) >90 mL/min   GFR calc Af Amer 20 (*) >90 mL/min   Comment:            The eGFR has been calculated     using the CKD EPI equation.     This calculation has not been     validated in all clinical     situations.     eGFR's persistently     <90 mL/min signify     possible Chronic Kidney Disease.  LIPASE, BLOOD     Status: Abnormal   Collection Time    10/23/12 10:03 AM      Result Value Range   Lipase 205 (*) 11 - 59 U/L  CBC WITH DIFFERENTIAL     Status: Abnormal   Collection Time    10/23/12 10:03 AM      Result Value Range   WBC 24.5 (*) 4.0 - 10.5 K/uL    RBC 5.10  4.22 - 5.81 MIL/uL   Hemoglobin 15.3  13.0 - 17.0 g/dL   HCT 27.2  53.6 - 64.4 %   MCV 87.8  78.0 - 100.0 fL   MCH 30.0  26.0 - 34.0 pg   MCHC 34.2  30.0 - 36.0 g/dL   RDW 03.4  74.2 - 59.5 %   Platelets 268  150 - 400 K/uL   Neutrophils Relative 87 (*) 43 - 77 %   Neutro Abs 21.3 (*) 1.7 - 7.7 K/uL   Lymphocytes Relative 3 (*) 12 - 46 %   Lymphs Abs 0.7  0.7 - 4.0 K/uL   Monocytes Relative 10  3 - 12 %   Monocytes Absolute 2.5 (*) 0.1 - 1.0 K/uL   Eosinophils Relative 0  0 - 5 %   Eosinophils Absolute 0.0  0.0 - 0.7 K/uL   Basophils Relative 0  0 - 1 %   Basophils Absolute 0.0  0.0 - 0.1 K/uL  CG4 I-STAT (LACTIC ACID)     Status: Abnormal   Collection Time    10/23/12 10:29 AM      Result Value Range   Lactic Acid, Venous 5.26 (*) 0.5 - 2.2 mmol/L  POCT I-STAT 3, BLOOD GAS (G3+)     Status: Abnormal   Collection Time    10/23/12 12:24 PM      Result Value Range   pH, Arterial 7.395  7.350 - 7.450   pCO2 arterial 37.1  35.0 - 45.0 mmHg   pO2, Arterial 68.0 (*) 80.0 - 100.0 mmHg   Bicarbonate 22.7  20.0 - 24.0 mEq/L   TCO2 24  0 - 100 mmol/L   O2 Saturation 93.0     Acid-base deficit 2.0  0.0 - 2.0 mmol/L   Patient temperature 98.6 F     Collection site RADIAL, ALLEN'S TEST ACCEPTABLE     Drawn by Operator     Sample type ARTERIAL      US Abdomen Complete  10/23/2012  *RADIOLOGY REPORT*  Clinical Data:  Abdominal pain.  ABDOMEN ULTRASOUND  Technique:  Complete abdominal ultrasound examination was performed including evaluation of the liver, gallbladder, bile ducts, pancreas, kidneys, spleen, IVC, and abdominal aorta.  Comparison:  CT on 07/22/2006.  Findings:  Gallbladder:  The gallbladder appears contracted and likely contains some sludge as well as probable shadowing calculi.  No sonographic Murphy's sign was elicited.  There is no evidence of pericholecystic fluid.  Mild  prominence of gallbladder wall thickness is likely related to contraction.  Common Bile Duct:   Normal caliber of 4 mm.  Liver:  The liver shows heterogeneous echotexture and no evidence of mass or biliary ductal dilatation.  IVC:  Patent throughout its visualized course in the abdomen.  Pancreas:  The pancreas is completely obscured by bowel gas.  Spleen:  The spleen is of normal echotexture and size.  Kidneys:  The right kidney measures approximately 10.4 cm and the left kidney 11.6 cm.  2.1 cm left renal cyst identified which shows suggestion of mild internal septation.  This cyst is stable in size compared to the prior CT in 2007.  No evidence of hydronephrosis.  Abdominal Aorta:  The abdominal aorta is completely obscured by bowel gas.  IMPRESSION: Contracted gallbladder likely containing sludge and some calculi. There is no evidence of biliary obstruction.   Original Report Authenticated By: Irish Lack, M.D.    Dg Abd Acute W/chest  10/23/2012  *RADIOLOGY REPORT*  Clinical Data: Abdominal pain  ACUTE ABDOMEN SERIES (ABDOMEN 2 VIEW & CHEST 1 VIEW)  Comparison: 07/22/2006  Findings: There is no free air.  There are abnormal dilated fluid and air filled loops of small intestine suggesting partial small bowel obstruction.  No abnormal calcifications or bony findings.  One-view chest shows normal heart and mediastinal shadows.  There is mild atelectasis at the left base.  No free air.  IMPRESSION: Abnormal fluid and air filled loops of small intestine in the central abdomen suggesting partial small bowel obstruction.   Original Report Authenticated By: Paulina Fusi, M.D.     Review of Systems  Constitutional: Negative for fever, chills, weight loss, malaise/fatigue and diaphoresis.  HENT: Negative.   Eyes: Negative.   Respiratory: Negative.   Cardiovascular: Negative.   Gastrointestinal: Positive for nausea, vomiting and abdominal pain (pain is mostly RUQ). Negative for diarrhea, constipation, blood in stool and melena.       Last BM Friday.  Musculoskeletal: Negative.   Skin: Negative.    Neurological: Negative.  Negative for weakness.  Psychiatric/Behavioral: Negative.        He has a hx of schizoaffective disorder and is on disability for that.  He works on their horse farm, He does have a hx of multiple hospitalizations for this, but none for about 4 years.   Blood pressure 105/65, pulse 126, temperature 97.3 F (36.3 C), resp. rate 23, SpO2 90.00%. Physical Exam  Constitutional: He appears well-developed and well-nourished. No distress.  HENT:  Head: Normocephalic and atraumatic.  Nose: Nose normal.  Eyes: Conjunctivae and EOM are normal. Pupils are equal, round, and reactive to light. Right eye exhibits no discharge. Left eye exhibits no discharge. No scleral icterus.  Neck: Normal range of motion. Neck supple. No JVD present. No tracheal deviation present. No thyromegaly present.  Cardiovascular: Normal heart sounds and intact distal pulses.  Exam reveals no gallop.   No murmur heard. Regular HR but tachycardic 114, on 3rd IV bolus.  BP 91/59.  Sats 100%  Respiratory: Effort normal and breath sounds normal. No stridor. No respiratory distress. He has no wheezes. He has no rales. He exhibits no tenderness.  GI: Soft. Bowel sounds are normal. He exhibits distension (minimal distension). He exhibits no mass. There is tenderness (some in the LLQ but primairly in the RUQ.). There is no rebound and no guarding.  Right now he seems fairly comfortable is able to sit up without any discomfort.  Musculoskeletal: Normal range of motion.  He exhibits no edema.  Lymphadenopathy:    He has no cervical adenopathy.  Neurological: He is alert. No cranial nerve deficit.  Skin: Skin is warm and dry. No rash noted. No erythema. No pallor.  Psychiatric: He has a normal mood and affect. His behavior is normal. Thought content normal.    Assessment/Plan: 1. 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 The gallbladder is currently contracted with no  Cholecystic fluid.  2. Acute renal failure 3. Hypokalemia and hyponatremia 4. Ileus versus partial small bowel obstruction.  No history of surgery. 5. History of hypertension on medication/currently having hypotension. 6. History of schizoaffective disorder, currently well-controlled on medication.  Plan: Lipase is minimally elevated, but he is in acute renal failure with acidosis which is improving with hydration. A CT scan with oral contrast has been requested. He has a history of pancreatitis with sludge noted approximately 15 years ago and it was opted not to remove his gallbladder at that time. He underwent colonoscopy 2011 which was normal. His LFTs are not significantly elevated. He has no prior history of surgery. Would recommend he be rehydrated and resolve his acute renal failure, along with his pancreatitis, to be followed with cholecystectomy. [facility] physician assistant for Dr. Glenna Fellows.  Steve Levy,Steve Levy 10/23/2012, 1:17 PM

## 2012-10-23 NOTE — ED Notes (Signed)
In xray for and [place]. resp called for gas but pt in xray

## 2012-10-23 NOTE — ED Notes (Signed)
resp called for blood gasses

## 2012-10-23 NOTE — ED Notes (Signed)
Pt drinking contrast for ct.

## 2012-10-23 NOTE — ED Notes (Signed)
Wife in room 3rd bolus started

## 2012-10-23 NOTE — ED Notes (Signed)
Surgery in to see pt.

## 2012-10-23 NOTE — ED Notes (Signed)
abd pain that started Friday w/ n/v  Went to see pcp on Friday am and was given vicodin and supp for nausea has not helped no diarrhea  Last vomited on sunday

## 2012-10-23 NOTE — Consult Note (Signed)
Patient interviewed and examined, agree with PA note above. He has apparent acute gallstone pancreatitis with dehydration and other metabolic derangements. Agree with bowel rest and hydration and close followup. Would plan cholecystectomy as his acute episode resolves. Discussed with the patient all his questions answered.  Mariella Saa MD, FACS  10/23/2012 7:14 PM

## 2012-10-23 NOTE — ED Notes (Signed)
Pt. Arrived from Pod D. Pt. Drinking po contrast,  Vitals stable, pt. Is alert and oriented X4, denies any pain, skin is p/w/d

## 2012-10-23 NOTE — ED Notes (Signed)
Bolus was started immed after first IV placed.

## 2012-10-24 LAB — COMPREHENSIVE METABOLIC PANEL
AST: 34 U/L (ref 0–37)
Albumin: 2.3 g/dL — ABNORMAL LOW (ref 3.5–5.2)
Alkaline Phosphatase: 74 U/L (ref 39–117)
BUN: 35 mg/dL — ABNORMAL HIGH (ref 6–23)
Chloride: 100 mEq/L (ref 96–112)
Potassium: 3.6 mEq/L (ref 3.5–5.1)
Sodium: 133 mEq/L — ABNORMAL LOW (ref 135–145)
Total Protein: 5.8 g/dL — ABNORMAL LOW (ref 6.0–8.3)

## 2012-10-24 LAB — LIPID PANEL
Cholesterol: 132 mg/dL (ref 0–200)
HDL: 8 mg/dL — ABNORMAL LOW (ref >39)
Total CHOL/HDL Ratio: 16.5 RATIO
VLDL: 35 mg/dL (ref 0–40)

## 2012-10-24 LAB — CBC
HCT: 35.8 % — ABNORMAL LOW (ref 39.0–52.0)
MCH: 30.1 pg (ref 26.0–34.0)
MCV: 87.7 fL (ref 78.0–100.0)
RDW: 13.2 % (ref 11.5–15.5)
WBC: 11.9 10*3/uL — ABNORMAL HIGH (ref 4.0–10.5)

## 2012-10-24 LAB — LIPASE, BLOOD: Lipase: 70 U/L — ABNORMAL HIGH (ref 11–59)

## 2012-10-24 MED ORDER — SODIUM CHLORIDE 0.9 % IV BOLUS (SEPSIS)
1000.0000 mL | Freq: Once | INTRAVENOUS | Status: AC
  Administered 2012-10-24: 1000 mL via INTRAVENOUS

## 2012-10-24 MED ORDER — SODIUM CHLORIDE 0.9 % IV SOLN
3.0000 g | Freq: Three times a day (TID) | INTRAVENOUS | Status: DC
  Administered 2012-10-24 – 2012-10-25 (×3): 3 g via INTRAVENOUS
  Filled 2012-10-24 (×4): qty 3

## 2012-10-24 MED ORDER — KCL IN DEXTROSE-NACL 20-5-0.9 MEQ/L-%-% IV SOLN
INTRAVENOUS | Status: DC
  Administered 2012-10-24 – 2012-10-26 (×5): via INTRAVENOUS
  Filled 2012-10-24 (×8): qty 1000

## 2012-10-24 NOTE — Progress Notes (Signed)
Utilization review completed.  

## 2012-10-24 NOTE — Progress Notes (Signed)
ANTIBIOTIC CONSULT NOTE - Follow-Up  Pharmacy Consult for unasyn Indication: pancreatitis, cholecystitis  No Known Allergies   Vital Signs: Temp: 98.4 F (36.9 C) (03/04 1034) Temp src: Oral (03/04 1034) BP: 136/75 mmHg (03/04 1034) Pulse Rate: 110 (03/04 1034) Intake/Output from previous day: 03/03 0701 - 03/04 0700 In: 1127.5 [I.V.:1027.5; IV Piggyback:100] Out: 1325 [Urine:1325] Intake/Output from this shift: Total I/O In: -  Out: 625 [Urine:625]  Labs:  Recent Labs  10/23/12 1003 10/23/12 2050 10/24/12 0640  WBC 24.5* 13.0* 11.9*  HGB 15.3 13.0 12.3*  PLT 268 203 226  CREATININE 3.65* 2.32* 1.58*   Estimated Creatinine Clearance: 55.2 ml/min (by C-G formula based on Cr of 1.58). No results found for this basename: VANCOTROUGH, VANCOPEAK, VANCORANDOM, GENTTROUGH, GENTPEAK, GENTRANDOM, TOBRATROUGH, TOBRAPEAK, TOBRARND, AMIKACINPEAK, AMIKACINTROU, AMIKACIN,  in the last 72 hours   Microbiology: No results found for this or any previous visit (from the past 720 hour(s)).  Medical History: Past Medical History  Diagnosis Date  . Chronic schizoaffective disorder   . Renal cyst     Assessment: Patient is a 52 y.o M presented to the ED with c/o n/v and RUQ pain and found to have pancreatitis.  Abd Xray with suspected partial small bowel obstruction.  Started on unasyn for empiric coverage. Scr now improved with hydration to 1.58.  Estimated CrCl ~ 55 ml/min.  Plan:  1) Increase Unasyn to 3g IV q 8 hrs. 2) Will follow for length of planned antibiotic therapy.  Tad Moore, BCPS  Clinical Pharmacist Pager [phone]  10/24/2012 11:08 AM

## 2012-10-24 NOTE — H&P (Signed)
Seen and examined.  Discussed with Dr. Mikel Cella.  Agree with her management and documentation.  Briefly, 52 yo male with abd pain.  He has been found to have pancreatitis.  It all fits nicely.  Likely gallstone pancreatitis, cholycystitis (we are treating for ascending cholangitis but I am not yet fully convinced), hypovolemia from 3rd spacing of pancreatitis, Acute kidney injury from hypovolemia and ileus from pancreatitis.  He seems to be responding nicely to treatment.  Surg is on board for cholecystectomy when he is more stable.

## 2012-10-24 NOTE — Progress Notes (Signed)
Subjective: Still feels bloated, and still hurts some.  Not real tender on exam.  Objective: Vital signs in last 24 hours: Temp:  [97.3 F (36.3 C)-99.6 F (37.6 C)] 99.6 F (37.6 C) (03/04 0604) Pulse Rate:  [97-126] 97 (03/04 0604) Resp:  [12-30] 18 (03/04 0604) BP: (76-135)/(35-92) 101/47 mmHg (03/04 0604) SpO2:  [90 %-100 %] 94 % (03/04 0604) Weight:  [179 lb 7.3 oz (81.4 kg)] 179 lb 7.3 oz (81.4 kg) (03/03 2012) Last BM Date: 10/22/12 NPO, I/O does not include fluid from ER, I know he was on 3rd liter when I saw him yesterday. Creatinine is better, so is lipase and LFT's are stable  BP is still a bit low, TM99.6 CT scan showed acute pancreatitis tail of pancrease, with extensive retroperitoneal fluid, pseudocyst possible, jejunal and descending colon inflammation/ileus, pleural and pericardial effusions. Liver cyst near dome and left lobe. Intake/Output from previous day: 03/03 0701 - 03/04 0700 In: 1127.5 [I.V.:1027.5; IV Piggyback:100] Out: 1325 [Urine:1325] Intake/Output this shift: Total I/O In: -  Out: 325 [Urine:325]  General appearance: alert, cooperative and no distress GI: soft, still distended, a little tender, but not much.  BS hypoactive.  Lab Results:   Recent Labs  10/23/12 2050 10/24/12 0640  WBC 13.0* 11.9*  HGB 13.0 12.3*  HCT 37.4* 35.8*  PLT 203 226    BMET  Recent Labs  10/23/12 2050 10/24/12 0640  NA 132* 133*  K 3.8 3.6  CL 99 100  CO2 23 24  GLUCOSE 97 112*  BUN 49* 35*  CREATININE 2.32* 1.58*  CALCIUM 7.4* 7.8*   PT/INR  Recent Labs  10/24/12 0640  LABPROT 14.6  INR 1.16     Recent Labs Lab 10/23/12 1003 10/24/12 0640  AST 74* 34  ALT 100* 54*  ALKPHOS 99 74  BILITOT 1.2 0.6  PROT 8.0 5.8*  ALBUMIN 3.3* 2.3*     Lipase     Component Value Date/Time   LIPASE 70* 10/24/2012 0640     Studies/Results: Ct Abdomen Pelvis Wo Contrast  10/23/2012  *RADIOLOGY REPORT*  Clinical Data: Abdominal pain.  CT ABDOMEN  AND PELVIS WITHOUT CONTRAST  Technique:  Multidetector CT imaging of the abdomen and pelvis was performed following the standard protocol without intravenous contrast.  Comparison: Abdominal ultrasound 10/24/2011 the CT abdomen pelvis 07/22/2006  Findings: Bibasilar airspace disease is worse on the left.  Small left pleural effusion is present.  A small pericardial effusion is noted. The heart size is normal.  An 11 mm cyst near the dome of the liver is stable.  A smaller cyst in the left lobe is also stable.  The liver is otherwise unremarkable.  The spleen is within normal limits.  The stomach is unremarkable.  The duodenum is within normal limits.  Extensive inflammatory changes and fluid are noted about the tail the pancreas.  Extensive free fluid is present in the abdomen.  There is no significant free air.  The gallbladder is contracted.  The common bile duct is within normal limits.  The adrenal glands are normal bilaterally.  The right kidney and ureter are within normal limits. A subtle hypodense lesion is present anteriorly in the left kidney with calcification along the inferior aspect of the lesion. This corresponds to the previously noted cyst.  No other definite renal lesions are present.  There is some inflammatory change about the left kidney.  The ureter is within normal limits.  Extensive inflammatory change in free fluid is noted along  the pararenal fascia.  No discrete abscess or pseudocyst is definitively identified. Pseudocysts are suspected.  The study is limited without IV contrast.  Contrast was not given secondary to acute renal failure.  There is diffuse distention of the small bowel with secondary inflammatory changes occurring in the proximal jejunum.  Secondary inflammatory changes are also present in the descending colon and splenic flexure.  There is fluid in the ascending colon.  The appendix is visualized and within normal limits.  The urinary bladder and prostate are within normal  limits.  The bone windows demonstrate no focal lytic or blastic lesions.  IMPRESSION:  1.  Acute pancreatitis with extensive retroperitoneal fluid and inflammatory change.  Pseudocyst are suspected but not clearly characterized without IV contrast. 2.  Secondary inflammation of the proximal jejunum and descending colon resulting in a diffuse ileus. 3.  Left greater than right basilar airspace disease.  While this may represent atelectasis, infection is not excluded. 4.  Small left pleural effusion. 5.  Small pericardial effusion. 6.  Cystic lesions of the liver are stable. 7.  Stable cystic lesion anteriorly in the left kidney.   Original Report Authenticated By: Marin Roberts, M.D.    US Abdomen Complete  10/23/2012  *RADIOLOGY REPORT*  Clinical Data:  Abdominal pain.  ABDOMEN ULTRASOUND  Technique:  Complete abdominal ultrasound examination was performed including evaluation of the liver, gallbladder, bile ducts, pancreas, kidneys, spleen, IVC, and abdominal aorta.  Comparison:  CT on 07/22/2006.  Findings:  Gallbladder:  The gallbladder appears contracted and likely contains some sludge as well as probable shadowing calculi.  No sonographic Murphy's sign was elicited.  There is no evidence of pericholecystic fluid.  Mild prominence of gallbladder wall thickness is likely related to contraction.  Common Bile Duct:  Normal caliber of 4 mm.  Liver:  The liver shows heterogeneous echotexture and no evidence of mass or biliary ductal dilatation.  IVC:  Patent throughout its visualized course in the abdomen.  Pancreas:  The pancreas is completely obscured by bowel gas.  Spleen:  The spleen is of normal echotexture and size.  Kidneys:  The right kidney measures approximately 10.4 cm and the left kidney 11.6 cm.  2.1 cm left renal cyst identified which shows suggestion of mild internal septation.  This cyst is stable in size compared to the prior CT in 2007.  No evidence of hydronephrosis.  Abdominal Aorta:  The  abdominal aorta is completely obscured by bowel gas.  IMPRESSION: Contracted gallbladder likely containing sludge and some calculi. There is no evidence of biliary obstruction.   Original Report Authenticated By: Irish Lack, M.D.    Dg Abd Acute W/chest  10/23/2012  *RADIOLOGY REPORT*  Clinical Data: Abdominal pain  ACUTE ABDOMEN SERIES (ABDOMEN 2 VIEW & CHEST 1 VIEW)  Comparison: 07/22/2006  Findings: There is no free air.  There are abnormal dilated fluid and air filled loops of small intestine suggesting partial small bowel obstruction.  No abnormal calcifications or bony findings.  One-view chest shows normal heart and mediastinal shadows.  There is mild atelectasis at the left base.  No free air.  IMPRESSION: Abnormal fluid and air filled loops of small intestine in the central abdomen suggesting partial small bowel obstruction.   Original Report Authenticated By: Paulina Fusi, M.D.     Medications: . ampicillin-sulbactam (UNASYN) IV  3 g Intravenous Q12H  . heparin  5,000 Units Subcutaneous Q8H  . pantoprazole (PROTONIX) IV  40 mg Intravenous Q24H  . QUEtiapine  400  mg Oral QHS  . sodium chloride  3 mL Intravenous Q12H    Assessment/Plan 1. 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 The gallbladder is currently contracted with no Cholecystic fluid.  2. Acute renal failure  3. Hypokalemia and hyponatremia  4. Ileus versus partial small bowel obstruction. No history of surgery.  5. History of hypertension on medication/currently having hypotension.  6. History of schizoaffective disorder, currently well-controlled on medication.   Plan:  He is still dry, HR 120, IV fluids at 150 ml/hr, I am going to give him some extra fluid bolus.  Continue abx, some ice chips for now.  He's distended, but no nausea..                                                                                    LOS: 1 day    JENNINGS,WILLARD 10/24/2012

## 2012-10-24 NOTE — Progress Notes (Signed)
Seen and examined.  Discussed with Dr. Adriana Simas.  Please see my co sign of the H&PE for my note of today.

## 2012-10-24 NOTE — Progress Notes (Signed)
Family Medicine Teaching Service Daily Progress Note Service Page: [phone]  Subjective:  Feeling okay this am. Little change in symptoms.  Still distending with some abdominal pain.  Reports some nausea.  No vomiting.  Overnight events: Hypotension - treated with NS bolus.  Continues to be tachycardic.  Objective: Temp:  [97.3 F (36.3 C)-99.6 F (37.6 C)] 99.6 F (37.6 C) (03/04 0604) Pulse Rate:  [97-126] 97 (03/04 0604) Resp:  [12-30] 18 (03/04 0604) BP: (76-135)/(35-92) 101/47 mmHg (03/04 0604) SpO2:  [90 %-100 %] 94 % (03/04 0604) Weight:  [179 lb 7.3 oz (81.4 kg)] 179 lb 7.3 oz (81.4 kg) (03/03 2012)  Exam: General: resting comfortably in bed. NAD. Cardiovascular: Tachycardic. Regular rhythm. No murmurs Respiratory: CTAB. No rales, rhonchi, or wheeze. Abdomen: mild distension noted. TTP - RUQ and epigastrium.  + Murphy's sign. Hypoactive bowel sounds. Extremities: No edema.  CBC   Recent Labs Lab 10/23/12 1003 10/23/12 2050  WBC 24.5* 13.0*  HGB 15.3 13.0  HCT 44.8 37.4*  PLT 268 203     CMP     Component Value Date/Time   NA 132* 10/23/2012 2050   K 3.8 10/23/2012 2050   CL 99 10/23/2012 2050   CO2 23 10/23/2012 2050   GLUCOSE 97 10/23/2012 2050   BUN 49* 10/23/2012 2050   CREATININE 2.32* 10/23/2012 2050   CALCIUM 7.4* 10/23/2012 2050   PROT 8.0 10/23/2012 1003   ALBUMIN 3.3* 10/23/2012 1003   AST 74* 10/23/2012 1003   ALT 100* 10/23/2012 1003   ALKPHOS 99 10/23/2012 1003   BILITOT 1.2 10/23/2012 1003   GFRNONAA 30* 10/23/2012 2050   GFRAA 35* 10/23/2012 2050   Lactic Acid - 5.26  Imaging/Diagnostic Tests:  Ct Abdomen Pelvis Wo Contrast 10/23/2012   IMPRESSION:  1.  Acute pancreatitis with extensive retroperitoneal fluid and inflammatory change.  Pseudocyst are suspected but not clearly characterized without IV contrast. 2.  Secondary inflammation of the proximal jejunum and descending colon resulting in a diffuse ileus. 3.  Left greater than right basilar airspace disease.   While this may represent atelectasis, infection is not excluded. 4.  Small left pleural effusion. 5.  Small pericardial effusion. 6.  Cystic lesions of the liver are stable. 7.  Stable cystic lesion anteriorly in the left kidney.  US Abdomen Complete 10/23/2012  IMPRESSION: Contracted gallbladder likely containing sludge and some calculi. There is no evidence of biliary obstruction.  Dg Abd Acute W/chest 10/23/2012  IMPRESSION: Abnormal fluid and air filled loops of small intestine in the central abdomen suggesting partial small bowel obstruction.  Assessment/Plan: 52 year old male with PMH of schizoaffective disorder and cholelithiasis presents with acute RUQ abdominal pain and nausea/vomiting; further workup revealed pancreatitis, cholelithiasis, Ileus, and AKI.  # Pancreatitis - Lipase 205 on admission. Likely secondary to biliary source.  Patient denied DM, alcohol, new medications, abdominal trauma on admission. - Will keep patient NPO for bowel rest. Patient has already received 3, NS 1000 mL boluses.  Tachycardic this am (sinus tach).  Patient given additional NS bolus this am.  Following bolus will continue D5 NS. - Surgery following (consulted in the ED) and we greatly appreciate their help. - Dilaudid 1 mg Q3 PRN pain - Zofran PRN nausea  # Cholelithiasis, possible cholecystitis - Gall bladder contracted on [place] and CT scan.  Sludging and calculi noted. - Likely causing pancreatitis and diffuse bowel inflammation - Surgery following.   # Ileus  - Patient refused NG tube. - Will monitor closely  during admission. Will assess daily for BM/passing gas - Surgery following   # AKI - Patient has baseline normal creatinine.  - Creatinine 3.65 on admission. - Creatinine improving this am - 1.58 - Will continue IV hydration. - Will monitor closely via daily metabolic panels.  # HTN - Patient was hypotensive on admission and overnight - Will hold home Lisinopril. - Will restart once  pressures improve  # Schizoaffective disorder  - Continue Seroquel qhs   FEN/GI: D5 NS with 20 mEq of KCl @ 150 PPx: Heparin SQ Dispo: Pending clinical improvement Code Status: Full code  Everlene Other, DO 10/24/2012, 6:57 AM

## 2012-10-24 NOTE — Progress Notes (Signed)
Patient interviewed and examined, agree with PA note above.  Improving in all parameters but severe episode of pancreatitis.  Continue to follow  Mariella Saa MD, FACS  10/24/2012 9:32 AM

## 2012-10-24 NOTE — Progress Notes (Signed)
Patient's HR sustaining in the 120's,BP 97/41.Patient denies any c/o's.Dr. Mikel Cella notified,MD made aware also of NGT not placed on pt as patient requested not to place it.Patient has not been nauseous since arrival to unit.MD says it's ok to leave out NGT.Will continue to monitor. Bokiagon, Charito Joselita,RN

## 2012-10-24 NOTE — Progress Notes (Signed)
10/23/12 20:10-Received patient from ED,admitted due to nausea,vomiting and abdominal pain.Patient is alert and oriented x4.Placed on telemetry #8,notified CCMT.VS stable.Patient requests not to place NGT at this time,pt denies any nausea.Informed pt will have to place NGT if he gets nauseous,pt agrees.Advised pt to call for any assist.Call bell within reach. Bokiagon, Charito Joselita,RN

## 2012-10-25 ENCOUNTER — Encounter (HOSPITAL_COMMUNITY): Payer: Self-pay | Admitting: Radiology

## 2012-10-25 ENCOUNTER — Inpatient Hospital Stay (HOSPITAL_COMMUNITY): Payer: Medicare Other

## 2012-10-25 LAB — COMPREHENSIVE METABOLIC PANEL
Albumin: 2.1 g/dL — ABNORMAL LOW (ref 3.5–5.2)
BUN: 18 mg/dL (ref 6–23)
Calcium: 8 mg/dL — ABNORMAL LOW (ref 8.4–10.5)
GFR calc Af Amer: 87 mL/min — ABNORMAL LOW (ref >90)
Glucose, Bld: 98 mg/dL (ref 70–99)
Sodium: 133 mEq/L — ABNORMAL LOW (ref 135–145)
Total Protein: 5.9 g/dL — ABNORMAL LOW (ref 6.0–8.3)

## 2012-10-25 LAB — CBC
HCT: 36.2 % — ABNORMAL LOW (ref 39.0–52.0)
Hemoglobin: 12.6 g/dL — ABNORMAL LOW (ref 13.0–17.0)
WBC: 13.1 10*3/uL — ABNORMAL HIGH (ref 4.0–10.5)

## 2012-10-25 MED ORDER — IOHEXOL 300 MG/ML  SOLN
100.0000 mL | Freq: Once | INTRAMUSCULAR | Status: AC | PRN
  Administered 2012-10-25: 100 mL via INTRAVENOUS

## 2012-10-25 MED ORDER — POTASSIUM CHLORIDE 10 MEQ/100ML IV SOLN
10.0000 meq | INTRAVENOUS | Status: AC
  Administered 2012-10-25 (×4): 10 meq via INTRAVENOUS
  Filled 2012-10-25 (×4): qty 100

## 2012-10-25 MED ORDER — IOHEXOL 300 MG/ML  SOLN
25.0000 mL | INTRAMUSCULAR | Status: AC
  Administered 2012-10-25 (×2): 25 mL via ORAL

## 2012-10-25 MED ORDER — SODIUM CHLORIDE 0.9 % IV BOLUS (SEPSIS)
1000.0000 mL | Freq: Once | INTRAVENOUS | Status: AC
  Administered 2012-10-25: 1000 mL via INTRAVENOUS

## 2012-10-25 MED ORDER — PIPERACILLIN-TAZOBACTAM 3.375 G IVPB
3.3750 g | Freq: Three times a day (TID) | INTRAVENOUS | Status: DC
  Administered 2012-10-25 – 2012-10-27 (×7): 3.375 g via INTRAVENOUS
  Filled 2012-10-25 (×9): qty 50

## 2012-10-25 NOTE — Progress Notes (Signed)
Patient interviewed and examined, agree with PA note above.  Mariella Saa MD, FACS  10/25/2012 11:24 AM

## 2012-10-25 NOTE — Progress Notes (Signed)
ANTIBIOTIC CONSULT NOTE - Follow-Up  Pharmacy Consult for :  change to zosyn from unasyn Indication: pancreatitis, cholecystitis  No Known Allergies   Vital Signs: Temp: 99.3 F (37.4 C) (03/05 0444) Temp src: Oral (03/05 0444) BP: 109/61 mmHg (03/05 0444) Pulse Rate: 108 (03/05 0444) Intake/Output from previous day: 03/04 0701 - 03/05 0700 In: 2562.5 [I.V.:2362.5; IV Piggyback:200] Out: 3301 [Urine:3300; Stool:1] Intake/Output from this shift:    Labs:  Recent Labs  10/23/12 2050 10/24/12 0640 10/25/12 0604  WBC 13.0* 11.9* 13.1*  HGB 13.0 12.3* 12.6*  PLT 203 226 239  CREATININE 2.32* 1.58* 1.10   Estimated Creatinine Clearance: 79.3 ml/min (by C-G formula based on Cr of 1.1). No results found for this basename: VANCOTROUGH, VANCOPEAK, VANCORANDOM, GENTTROUGH, GENTPEAK, GENTRANDOM, TOBRATROUGH, TOBRAPEAK, TOBRARND, AMIKACINPEAK, AMIKACINTROU, AMIKACIN,  in the last 72 hours   Microbiology: No results found for this or any previous visit (from the past 720 hour(s)).  Medical History: Past Medical History  Diagnosis Date  . Chronic schizoaffective disorder   . Renal cyst     Assessment: [name] is a 52 y.o M admitted with severe pancreatitis, ARF, ileus vs pSBO.  Pharmacy has been consulted to change unasyn to zosyn for gall bladder coverage. Unasyn was started 10/23/12.   His Scr has improved to 1.10 from admit level of 3.65 with hydration.  His T max is 100.4.  His WBC is 13.1  Plan:  1) Zosyn 3.375 gm IV q8h, infuse each dose over 4  hours 2) Will follow for length of planned antibiotic therapy. Herby Abraham, Pharm.D. [phone] 10/25/2012 8:27 AM

## 2012-10-25 NOTE — Progress Notes (Signed)
Family Medicine Teaching Service Daily Progress Note Service Page: [phone]  Subjective:  Feeling ok this morning. Has been passing gas. Had a loose BM. Pain has improved and has only needed pain meds once per patient overnight.  Objective: Temp:  [98.4 F (36.9 C)-100.4 F (38 C)] 99.3 F (37.4 C) (03/05 0444) Pulse Rate:  [108-118] 108 (03/05 0444) Resp:  [18-20] 18 (03/05 0444) BP: (109-136)/(61-75) 109/61 mmHg (03/05 0444) SpO2:  [94 %-98 %] 94 % (03/05 0444) Weight:  [179 lb 7.3 oz (81.401 kg)] 179 lb 7.3 oz (81.401 kg) (03/04 2108)  Exam: General: resting comfortably in bed. NAD. Cardiovascular: Tachycardic. Regular rhythm. No murmurs Respiratory: CTAB. No rales, rhonchi, or wheeze. Abdomen: mild distension noted. TTP - epigastrium. +BS Extremities: No edema.  CBC   Recent Labs Lab 10/23/12 2050 10/24/12 0640 10/25/12 0604  WBC 13.0* 11.9* 13.1*  HGB 13.0 12.3* 12.6*  HCT 37.4* 35.8* 36.2*  PLT 203 226 239     CMP     Component Value Date/Time   NA 133* 10/25/2012 0604   K 3.3* 10/25/2012 0604   CL 100 10/25/2012 0604   CO2 22 10/25/2012 0604   GLUCOSE 98 10/25/2012 0604   BUN 18 10/25/2012 0604   CREATININE 1.10 10/25/2012 0604   CALCIUM 8.0* 10/25/2012 0604   PROT 5.9* 10/25/2012 0604   ALBUMIN 2.1* 10/25/2012 0604   AST 32 10/25/2012 0604   ALT 43 10/25/2012 0604   ALKPHOS 97 10/25/2012 0604   BILITOT 0.5 10/25/2012 0604   GFRNONAA 75* 10/25/2012 0604   GFRAA 87* 10/25/2012 0604   Lactic Acid - 5.26  Imaging/Diagnostic Tests:  Ct Abdomen Pelvis Wo Contrast 10/23/2012   IMPRESSION:  1.  Acute pancreatitis with extensive retroperitoneal fluid and inflammatory change.  Pseudocyst are suspected but not clearly characterized without IV contrast. 2.  Secondary inflammation of the proximal jejunum and descending colon resulting in a diffuse ileus. 3.  Left greater than right basilar airspace disease.  While this may represent atelectasis, infection is not excluded. 4.  Small left  pleural effusion. 5.  Small pericardial effusion. 6.  Cystic lesions of the liver are stable. 7.  Stable cystic lesion anteriorly in the left kidney.  US Abdomen Complete 10/23/2012  IMPRESSION: Contracted gallbladder likely containing sludge and some calculi. There is no evidence of biliary obstruction.  Dg Abd Acute W/chest 10/23/2012  IMPRESSION: Abnormal fluid and air filled loops of small intestine in the central abdomen suggesting partial small bowel obstruction.  Assessment/Plan: 52 year old male with PMH of schizoaffective disorder and cholelithiasis presents with acute RUQ abdominal pain and nausea/vomiting; further workup revealed pancreatitis, cholelithiasis, Ileus, and AKI.  # Pancreatitis - Lipase 205 on admission. Likely secondary to biliary source.  Patient denied DM, alcohol, new medications, abdominal trauma on admission. - Will keep patient NPO for bowel rest. Patient has already received 4 NS 1000 mL boluses.  Tachycardic this am (sinus tach).  Patient given additional NS bolus this am.  Following bolus will continue D5 NS. - Surgery following (consulted in the ED) and we greatly appreciate their help. - Dilaudid 1 mg Q3 PRN pain - Zofran PRN nausea  # Cholelithiasis, possible cholecystitis - Gall bladder contracted on [place] and CT scan.  Sludging and calculi noted. - Likely causing pancreatitis and diffuse bowel inflammation - Surgery following.  - on zosyn per pharmacy  # Ileus-now passing gas and loose stools - Patient refused NG tube. - Will monitor closely during admission. Will assess  daily for BM/passing gas - Surgery following   # AKI - Patient has baseline normal creatinine.  - Creatinine 3.65 on admission. - Creatinine improving this am - 1.1 - Will continue IV hydration. - Will monitor closely via daily metabolic panels.  # HTN - Patient was hypotensive on admission-improved overnight - Will hold home Lisinopril. - Will restart once pressures improve  #  Schizoaffective disorder  - Continue Seroquel qhs   FEN/GI: D5 NS with 20 mEq of KCl @ 150 PPx: Heparin SQ Dispo: Pending clinical improvement Code Status: Full code  Steve Alar, MD 10/25/2012, 9:31 AM

## 2012-10-25 NOTE — Progress Notes (Signed)
Patient ID: Steve Levy, male   DOB: 1960-12-27, 52 y.o.   MRN: [id number]    Subjective: Pt feels much better today.  Started passing some flatus and had a couple BMs  Objective: Vital signs in last 24 hours: Temp:  [98.4 F (36.9 C)-100.4 F (38 C)] 99.9 F (37.7 C) (03/05 0735) Pulse Rate:  [98-118] 98 (03/05 0735) Resp:  [18-20] 18 (03/05 0735) BP: (105-136)/(58-75) 105/58 mmHg (03/05 0735) SpO2:  [94 %-98 %] 95 % (03/05 0735) Weight:  [179 lb 7.3 oz (81.401 kg)] 179 lb 7.3 oz (81.401 kg) (03/04 2108) Last BM Date: 10/24/12  Intake/Output from previous day: 03/04 0701 - 03/05 0700 In: 2562.5 [I.V.:2362.5; IV Piggyback:200] Out: 3301 [Urine:3300; Stool:1] Intake/Output this shift: Total I/O In: 0  Out: 100 [Urine:100]  PE: Abd: soft, minimally tender in upper abdomen, hypoactive BS, ND Heart: regular Lungs: CTAB  Lab Results:   Recent Labs  10/24/12 0640 10/25/12 0604  WBC 11.9* 13.1*  HGB 12.3* 12.6*  HCT 35.8* 36.2*  PLT 226 239   BMET  Recent Labs  10/24/12 0640 10/25/12 0604  NA 133* 133*  K 3.6 3.3*  CL 100 100  CO2 24 22  GLUCOSE 112* 98  BUN 35* 18  CREATININE 1.58* 1.10  CALCIUM 7.8* 8.0*   PT/INR  Recent Labs  10/24/12 0640  LABPROT 14.6  INR 1.16   CMP     Component Value Date/Time   NA 133* 10/25/2012 0604   K 3.3* 10/25/2012 0604   CL 100 10/25/2012 0604   CO2 22 10/25/2012 0604   GLUCOSE 98 10/25/2012 0604   BUN 18 10/25/2012 0604   CREATININE 1.10 10/25/2012 0604   CALCIUM 8.0* 10/25/2012 0604   PROT 5.9* 10/25/2012 0604   ALBUMIN 2.1* 10/25/2012 0604   AST 32 10/25/2012 0604   ALT 43 10/25/2012 0604   ALKPHOS 97 10/25/2012 0604   BILITOT 0.5 10/25/2012 0604   GFRNONAA 75* 10/25/2012 0604   GFRAA 87* 10/25/2012 0604   Lipase     Component Value Date/Time   LIPASE 70* 10/24/2012 0640       Studies/Results: Ct Abdomen Pelvis Wo Contrast  10/23/2012  *RADIOLOGY REPORT*  Clinical Data: Abdominal pain.  CT ABDOMEN AND PELVIS WITHOUT CONTRAST   Technique:  Multidetector CT imaging of the abdomen and pelvis was performed following the standard protocol without intravenous contrast.  Comparison: Abdominal ultrasound 10/24/2011 the CT abdomen pelvis 07/22/2006  Findings: Bibasilar airspace disease is worse on the left.  Small left pleural effusion is present.  A small pericardial effusion is noted. The heart size is normal.  An 11 mm cyst near the dome of the liver is stable.  A smaller cyst in the left lobe is also stable.  The liver is otherwise unremarkable.  The spleen is within normal limits.  The stomach is unremarkable.  The duodenum is within normal limits.  Extensive inflammatory changes and fluid are noted about the tail the pancreas.  Extensive free fluid is present in the abdomen.  There is no significant free air.  The gallbladder is contracted.  The common bile duct is within normal limits.  The adrenal glands are normal bilaterally.  The right kidney and ureter are within normal limits. A subtle hypodense lesion is present anteriorly in the left kidney with calcification along the inferior aspect of the lesion. This corresponds to the previously noted cyst.  No other definite renal lesions are present.  There is some inflammatory change about  the left kidney.  The ureter is within normal limits.  Extensive inflammatory change in free fluid is noted along the pararenal fascia.  No discrete abscess or pseudocyst is definitively identified. Pseudocysts are suspected.  The study is limited without IV contrast.  Contrast was not given secondary to acute renal failure.  There is diffuse distention of the small bowel with secondary inflammatory changes occurring in the proximal jejunum.  Secondary inflammatory changes are also present in the descending colon and splenic flexure.  There is fluid in the ascending colon.  The appendix is visualized and within normal limits.  The urinary bladder and prostate are within normal limits.  The bone windows  demonstrate no focal lytic or blastic lesions.  IMPRESSION:  1.  Acute pancreatitis with extensive retroperitoneal fluid and inflammatory change.  Pseudocyst are suspected but not clearly characterized without IV contrast. 2.  Secondary inflammation of the proximal jejunum and descending colon resulting in a diffuse ileus. 3.  Left greater than right basilar airspace disease.  While this may represent atelectasis, infection is not excluded. 4.  Small left pleural effusion. 5.  Small pericardial effusion. 6.  Cystic lesions of the liver are stable. 7.  Stable cystic lesion anteriorly in the left kidney.   Original Report Authenticated By: Marin Roberts, M.D.    US Abdomen Complete  10/23/2012  *RADIOLOGY REPORT*  Clinical Data:  Abdominal pain.  ABDOMEN ULTRASOUND  Technique:  Complete abdominal ultrasound examination was performed including evaluation of the liver, gallbladder, bile ducts, pancreas, kidneys, spleen, IVC, and abdominal aorta.  Comparison:  CT on 07/22/2006.  Findings:  Gallbladder:  The gallbladder appears contracted and likely contains some sludge as well as probable shadowing calculi.  No sonographic Murphy's sign was elicited.  There is no evidence of pericholecystic fluid.  Mild prominence of gallbladder wall thickness is likely related to contraction.  Common Bile Duct:  Normal caliber of 4 mm.  Liver:  The liver shows heterogeneous echotexture and no evidence of mass or biliary ductal dilatation.  IVC:  Patent throughout its visualized course in the abdomen.  Pancreas:  The pancreas is completely obscured by bowel gas.  Spleen:  The spleen is of normal echotexture and size.  Kidneys:  The right kidney measures approximately 10.4 cm and the left kidney 11.6 cm.  2.1 cm left renal cyst identified which shows suggestion of mild internal septation.  This cyst is stable in size compared to the prior CT in 2007.  No evidence of hydronephrosis.  Abdominal Aorta:  The abdominal aorta is  completely obscured by bowel gas.  IMPRESSION: Contracted gallbladder likely containing sludge and some calculi. There is no evidence of biliary obstruction.   Original Report Authenticated By: Irish Lack, M.D.    Dg Abd Acute W/chest  10/23/2012  *RADIOLOGY REPORT*  Clinical Data: Abdominal pain  ACUTE ABDOMEN SERIES (ABDOMEN 2 VIEW & CHEST 1 VIEW)  Comparison: 07/22/2006  Findings: There is no free air.  There are abnormal dilated fluid and air filled loops of small intestine suggesting partial small bowel obstruction.  No abnormal calcifications or bony findings.  One-view chest shows normal heart and mediastinal shadows.  There is mild atelectasis at the left base.  No free air.  IMPRESSION: Abnormal fluid and air filled loops of small intestine in the central abdomen suggesting partial small bowel obstruction.   Original Report Authenticated By: Paulina Fusi, M.D.     Anti-infectives: Anti-infectives   Start     Dose/Rate Route Frequency Ordered  Stop   10/25/12 0900  piperacillin-tazobactam (ZOSYN) IVPB 3.375 g     3.375 g 12.5 mL/hr over 240 Minutes Intravenous 3 times per day 10/25/12 0818     10/24/12 1200  Ampicillin-Sulbactam (UNASYN) 3 g in sodium chloride 0.9 % 100 mL IVPB  Status:  Discontinued     3 g 100 mL/hr over 60 Minutes Intravenous Every 8 hours 10/24/12 1109 10/25/12 0805   10/24/12 0200  Ampicillin-Sulbactam (UNASYN) 3 g in sodium chloride 0.9 % 100 mL IVPB  Status:  Discontinued     3 g 100 mL/hr over 60 Minutes Intravenous Every 12 hours 10/23/12 1946 10/24/12 1109   10/23/12 1245  Ampicillin-Sulbactam (UNASYN) 3 g in sodium chloride 0.9 % 100 mL IVPB  Status:  Discontinued     3 g 100 mL/hr over 60 Minutes Intravenous  Once 10/23/12 1230 10/23/12 1236   10/23/12 1245  Ampicillin-Sulbactam (UNASYN) 3 g in sodium chloride 0.9 % 100 mL IVPB     3 g 100 mL/hr over 60 Minutes Intravenous  Once 10/23/12 1237 10/23/12 1450       Assessment/Plan  1. Gallstone  pancreatitis 2. Possible developing pseudocyst  Plan: 1. Will allow clear liquids today.  May need a repeat CT scan prior to surgery to further evaluate this area of concern for pseudocyst.  If this persists, then we may need to hold off on surgery for a while until this resolves or improves.  If this is better on CT scan then hopefully we can proceed with OR this admission.   LOS: 2 days    Jejuan Scala E 10/25/2012, 9:57 AM Pager: [phone]

## 2012-10-25 NOTE — Progress Notes (Signed)
Seen and examined.  Definite improvement in pain and now ileus is breaking up with both flatus and stool.  For CT scan today.  Will discuss timing of surg with surgeons.

## 2012-10-26 LAB — CBC
HCT: 33.3 % — ABNORMAL LOW (ref 39.0–52.0)
MCHC: 34.5 g/dL (ref 30.0–36.0)
Platelets: 250 10*3/uL (ref 150–400)
RDW: 13.4 % (ref 11.5–15.5)

## 2012-10-26 LAB — BASIC METABOLIC PANEL
BUN: 8 mg/dL (ref 6–23)
Creatinine, Ser: 1.15 mg/dL (ref 0.50–1.35)
GFR calc Af Amer: 82 mL/min — ABNORMAL LOW (ref >90)
GFR calc non Af Amer: 71 mL/min — ABNORMAL LOW (ref >90)
Potassium: 3.1 mEq/L — ABNORMAL LOW (ref 3.5–5.1)

## 2012-10-26 LAB — HEPATIC FUNCTION PANEL
AST: 40 U/L — ABNORMAL HIGH (ref 0–37)
Bilirubin, Direct: 0.2 mg/dL (ref 0.0–0.3)
Indirect Bilirubin: 0.3 mg/dL (ref 0.3–0.9)
Total Protein: 5.4 g/dL — ABNORMAL LOW (ref 6.0–8.3)

## 2012-10-26 MED ORDER — SODIUM CHLORIDE 0.9 % IV SOLN
INTRAVENOUS | Status: DC
  Administered 2012-10-26 – 2012-10-30 (×5): via INTRAVENOUS

## 2012-10-26 MED ORDER — POTASSIUM CHLORIDE CRYS ER 20 MEQ PO TBCR
40.0000 meq | EXTENDED_RELEASE_TABLET | Freq: Two times a day (BID) | ORAL | Status: AC
  Administered 2012-10-26 (×2): 40 meq via ORAL
  Filled 2012-10-26 (×2): qty 2

## 2012-10-26 NOTE — Progress Notes (Signed)
Family Medicine Teaching Service Daily Progress Note Service Page: [phone]  Subjective: Feeling much improved. Abdominal pain improved.  Reports frequent flatus and BM (diarrhea).  Objective: Temp:  [97.8 F (36.6 C)-99.9 F (37.7 C)] 99 F (37.2 C) (03/06 0612) Pulse Rate:  [94-123] 123 (03/06 0612) Resp:  [17-18] 17 (03/06 0612) BP: (102-142)/(58-85) 142/85 mmHg (03/06 0612) SpO2:  [94 %-96 %] 94 % (03/06 0612) Weight:  [184 lb 12.8 oz (83.825 kg)] 184 lb 12.8 oz (83.825 kg) (03/05 2108)  Exam: General: resting comfortably in bed. NAD.  Cardiovascular: Tachycardic. Regular rhythm. No murmurs  Respiratory: CTAB. No rales, rhonchi, or wheeze.  Abdomen: soft, slightly TTP - epigastrium, RUQ. Distension improved.  +BS. Extremities: No edema.  CBC BMET   Recent Labs Lab 10/23/12 2050 10/24/12 0640 10/25/12 0604  WBC 13.0* 11.9* 13.1*  HGB 13.0 12.3* 12.6*  HCT 37.4* 35.8* 36.2*  PLT 203 226 239    Recent Labs Lab 10/23/12 2050 10/24/12 0640 10/25/12 0604  NA 132* 133* 133*  K 3.8 3.6 3.3*  CL 99 100 100  CO2 23 24 22   BUN 49* 35* 18  CREATININE 2.32* 1.58* 1.10  GLUCOSE 97 112* 98  CALCIUM 7.4* 7.8* 8.0*     Imaging/Diagnostic Tests:  Ct Abdomen Pelvis W Contrast 10/25/2012   IMPRESSION:  1.  Findings consistent with pancreatitis.  Fluid collection anterior to the pancreas is compatible with developing pseudocyst. Negative for evidence of pancreatic necrosis. 2.  Dilatation of small bowel with wall thickening and air-fluid levels likely due to ileus and mesenteric edema. 3.  Small gallstones. 4.  Left renal cyst. 5.  Prostate gland enlargement.  Ct Abdomen Pelvis Wo Contrast 10/23/2012   IMPRESSION:  1.  Acute pancreatitis with extensive retroperitoneal fluid and inflammatory change.  Pseudocyst are suspected but not clearly characterized without IV contrast. 2.  Secondary inflammation of the proximal jejunum and descending colon resulting in a diffuse ileus. 3.   Left greater than right basilar airspace disease.  While this may represent atelectasis, infection is not excluded. 4.  Small left pleural effusion. 5.  Small pericardial effusion. 6.  Cystic lesions of the liver are stable. 7.  Stable cystic lesion anteriorly in the left kidney.  US Abdomen Complete 10/23/2012  IMPRESSION:  1.  Findings consistent with pancreatitis.  Fluid collection anterior to the pancreas is compatible with developing pseudocyst. Negative for evidence of pancreatic necrosis. 2.  Dilatation of small bowel with wall thickening and air-fluid levels likely due to ileus and mesenteric edema. 3.  Small gallstones. 4.  Left renal cyst. 5.  Prostate gland enlargement.     Dg Abd Acute W/chest 10/23/2012   IMPRESSION: Abnormal fluid and air filled loops of small intestine in the central abdomen suggesting partial small bowel obstruction.  Assessment/Plan: 52 year old male with PMH of schizoaffective disorder and cholelithiasis presents with acute RUQ abdominal pain and nausea/vomiting; further workup revealed pancreatitis, cholelithiasis, Ileus, and AKI.   # Pancreatitis - Lipase 205 on admission. Likely secondary to biliary source. Patient denied DM, alcohol, new medications, abdominal trauma on admission.  - Patient now on clear liquids.  Continuing IV fluids. - Surgery following (consulted in the ED) and we greatly appreciate their help.   - Will advance to full liquids today. - Dilaudid 1 mg Q3 PRN pain  - Zofran PRN nausea   # Cholelithiasis, possible cholecystitis  - Gall bladder contracted on [place] and CT scan. Sludging and calculi noted.  - Likely causing  pancreatitis and diffuse bowel inflammation.  CT abdomen and pelvis obtained yesterday.  Will not have surgery this admission. - D/C'd antibiotics today after speaking with surgery.  # Ileus - resolving.  Passing gas and had BM on 3/5. - Will monitor closely during admission. Will assess daily for BM/passing gas  - Surgery  following   # AKI  - Patient has baseline normal creatinine.  - Creatinine 3.65 on admission.  - Will continue IV hydration.  - Will monitor closely via daily metabolic panels.   # HTN  - Currently holding antihypertensives  # Schizoaffective disorder  - Continue Seroquel qhs   FEN/GI: NS @ 75 mL/hr PPx: Heparin SQ  Dispo: Pending clinical improvement  Code Status: Full code   Everlene Other, DO 10/26/2012, 7:16 AM

## 2012-10-26 NOTE — Discharge Summary (Signed)
Family Medicine Teaching [facility] Discharge Summary  Patient name: Steve Levy Medical record number: [id number] Date of birth: 1960-10-04 Age: 52 y.o. Gender: male Date of Admission: 10/23/2012  Date of Discharge: 10/30/12 Admitting Physician: Sanjuana Letters, MD  Primary Care Brennan Litzinger: Ailene Ravel, MD  Indication for Hospitalization: Abdominal pain Discharge Diagnoses:  1) Pancreatitis 2) Cholelithiasis 3) Ileus 4) Acute Kidney Injury 5) HTN 6) Schizoaffective disorder  Brief Hospital Course:  52 year old male with PMH of schizoaffective disorder and HTN who presented with acute RUQ abdominal pain and nausea/vomiting.  Further work up revealed evidence of pancreatitis with Lipase of 205 as well as cholelithiasis, and AKI.  1) Pancreatitis - On initial presentation patient was hypotensive and tachycardic. - Given abdominal pain, CMP was obtained.  Lipase was significantly elevated at 205.  This was later confirmed via CT findings (see below). - Patient was treated Zofran, PRN Dilaudid for pain, bowel rest and aggressive IV fluids, receiving a total of 5, NS 1L boluses during admission.  Additionally, given extensive inflammation seen on CT scan patient was treated with IV Ampicillin and later IV Zosyn. - Diet was slowly advanced during admission.  Patient was tolerating soft mechanical diet prior to D/C.  2) Cholelithiasis - Patient has a history of gallstones. - US of the abdomen revealed biliary sludge, contracted gallbladder, and gallstones.  This was thought to be the likely cause of pancreatitis. - General surgery was consulted in the ED and followed the patient throughout hospitalization.   - Repeat CT scan was obtained x 2 for evaluation for potential cholecystectomy.  CT scan on 3/10 revealed findings consistent with consistent with organizing pancreatic pseudocysts.  Due to CT findings, cholecystectomy was not performed. - Patient is to follow up with  surgery for additional CT and revaluation.  3) Ileus - Patients abdomen was distended on admission with hypoactive bowel sounds. - Acute Abdominal series was obtained to evaluate for possible SBO. It was concerning for SBO (see imaging below). - CT abdomen/pelvis was subsequently obtained and revealed extensive inflammation of the proximal jejunum and descending colon resulting in a diffuse ileus. - Patient was monitored closely during admission and ileus slowly resolved. - Patient was passing gas and producing BM's prior to discharge.  4) Acute Kidney Injury - Creatinine was significantly elevated at 3.5 on admission. - Creatinine improved back to baseline following aggressive IV fluids.  5) HTN - Antihypertensives were held initially as patient was hypotensive. - Lisinopril later restarted as BP improved.  6) Schizoaffective disorder - Continued home Seroquel.  Significant Labs and Imaging:   CBC BMET   Recent Labs Lab 10/28/12 0500 10/29/12 0935 10/30/12 0543  WBC 14.9* 19.7* 14.6*  HGB 11.7* 13.1 11.4*  HCT 34.1* 38.5* 34.4*  PLT 278 324 305    Recent Labs Lab 10/28/12 0500 10/29/12 0935 10/30/12 0543  NA 136 135 137  K 3.2* 3.7 4.3  CL 98 96 101  CO2 28 27 29   BUN 7 8 9   CREATININE 1.04 1.13 1.08  GLUCOSE 96 110* 94  CALCIUM 8.2* 8.4 8.1*     Ct Abdomen Pelvis W Contrast 10/30/2012   IMPRESSION:  1.  Slight increase in size of more organized, rim enhancing fluid collections in the left abdomen, consistent with organizing pancreatic pseudocysts. 2.  Improving small bowel ileus. 3.  Stable cholelithiasis and mild hepatic steatosis. 4.  Probable small acute splenic infarct. 4.  Persistent bilateral lower lobe atelectasis and small left pleural effusion.  Ct Abdomen Pelvis W Contrast 10/25/2012  IMPRESSION:  1.  Findings consistent with pancreatitis.  Fluid collection anterior to the pancreas is compatible with developing pseudocyst. Negative for evidence of  pancreatic necrosis. 2.  Dilatation of small bowel with wall thickening and air-fluid levels likely due to ileus and mesenteric edema. 3.  Small gallstones. 4.  Left renal cyst. 5.  Prostate gland enlargement.  Ct Abdomen Pelvis Wo Contrast 10/23/2012   IMPRESSION:  1.  Acute pancreatitis with extensive retroperitoneal fluid and inflammatory change.  Pseudocyst are suspected but not clearly characterized without IV contrast. 2.  Secondary inflammation of the proximal jejunum and descending colon resulting in a diffuse ileus. 3.  Left greater than right basilar airspace disease.  While this may represent atelectasis, infection is not excluded. 4.  Small left pleural effusion. 5.  Small pericardial effusion. 6.  Cystic lesions of the liver are stable. 7.  Stable cystic lesion anteriorly in the left kidney.     US Abdomen Complete 10/23/2012  IMPRESSION: Contracted gallbladder likely containing sludge and some calculi. There is no evidence of biliary obstruction.     Dg Abd Acute W/chest 10/23/2012   IMPRESSION: Abnormal fluid and air filled loops of small intestine in the central abdomen suggesting partial small bowel obstruction.   Procedures: None  Consultations: General surgery, Dr. Johna Sheriff.  Discharge Medications:    Medication List    TAKE these medications       lisinopril 10 MG tablet  Commonly known as:  PRINIVIL,ZESTRIL  Take 10 mg by mouth daily.     ondansetron 4 MG tablet  Commonly known as:  ZOFRAN  Take 1 tablet (4 mg total) by mouth every 6 (six) hours as needed for nausea.     oxyCODONE-acetaminophen 5-325 MG per tablet  Commonly known as:  PERCOCET/ROXICET  Take 1-2 tablets by mouth every 6 (six) hours as needed.     QUEtiapine 400 MG tablet  Commonly known as:  SEROQUEL  Take 400 mg by mouth at bedtime.     QUEtiapine 400 MG tablet  Commonly known as:  SEROQUEL  Take 400 mg by mouth at bedtime as needed. In addition to scheduled 400 mg po qHS dose.       Issues  for Follow Up:  1) Continued improvement in abdominal pain and toleration of diet. 2) Patient will need to follow up with surgery for repeat CT scan and re-evaluation for surgery 3) Recommend rechecking BMP to reassess potassium and renal function.  Outstanding Results: None  Discharge Instructions: Patient was counseled important signs and symptoms that should prompt return to medical care, changes in medications, dietary instructions, activity restrictions, and follow up appointments.    Follow-up Information   Schedule an appointment as soon as possible for a visit with Ailene Ravel, MD.   Contact information:   Dr. Burnell Blanks [address] [place] [address] [phone]       Follow up with Mariella Saa, MD. Schedule an appointment as soon as possible for a visit in 2 weeks.   Contact information:   [address] [place] [address] [phone]       Discharge Condition: Stable. Discharged home.  Everlene Other, DO 10/30/2012, 8:35 PM

## 2012-10-26 NOTE — Progress Notes (Signed)
Seen and examined.  Agree with Dr. Adriana Simas.  We will work with surgery for the staging on his continued treatment.  From a medical standpoint, we are approaching the end of his need for hospitalization.

## 2012-10-26 NOTE — Progress Notes (Signed)
Patient interviewed and examined, agree with PA note above.  Mariella Saa MD, FACS  10/26/2012 12:18 PM

## 2012-10-26 NOTE — Progress Notes (Signed)
Subjective: Feels better, tolerating his clears, doesn't care much for it.  Objective: Vital signs in last 24 hours: Temp:  [97.8 F (36.6 C)-99.7 F (37.6 C)] 99 F (37.2 C) (03/06 0612) Pulse Rate:  [94-123] 123 (03/06 0612) Resp:  [17-18] 17 (03/06 0612) BP: (102-142)/(60-85) 142/85 mmHg (03/06 0612) SpO2:  [94 %-96 %] 94 % (03/06 0612) Weight:  [184 lb 12.8 oz (83.825 kg)] 184 lb 12.8 oz (83.825 kg) (03/05 2108) Last BM Date: 10/25/12 600 ml PO yesterday recorded, afebrile, VSS,  Still some tachycardia, HR up to 123 -- 6AM.  Intake/Output from previous day: 03/05 0701 - 03/06 0700 In: 4587.5 [P.O.:600; I.V.:3487.5; IV Piggyback:500] Out: 400 [Urine:400] Intake/Output this shift:    General appearance: alert, cooperative and no distress GI: soft, distended, not tender like before.  +BS.  Lab Results:   Recent Labs  10/24/12 0640 10/25/12 0604  WBC 11.9* 13.1*  HGB 12.3* 12.6*  HCT 35.8* 36.2*  PLT 226 239    BMET  Recent Labs  10/24/12 0640 10/25/12 0604  NA 133* 133*  K 3.6 3.3*  CL 100 100  CO2 24 22  GLUCOSE 112* 98  BUN 35* 18  CREATININE 1.58* 1.10  CALCIUM 7.8* 8.0*   PT/INR  Recent Labs  10/24/12 0640  LABPROT 14.6  INR 1.16     Recent Labs Lab 10/23/12 1003 10/24/12 0640 10/25/12 0604  AST 74* 34 32  ALT 100* 54* 43  ALKPHOS 99 74 97  BILITOT 1.2 0.6 0.5  PROT 8.0 5.8* 5.9*  ALBUMIN 3.3* 2.3* 2.1*     Lipase     Component Value Date/Time   LIPASE 70* 10/24/2012 0640     Studies/Results: Ct Abdomen Pelvis W Contrast  10/25/2012  *RADIOLOGY REPORT*  Clinical Data: Abdominal pain, nausea and vomiting.  CT ABDOMEN AND PELVIS WITH CONTRAST  Technique:  Multidetector CT imaging of the abdomen and pelvis was performed following the standard protocol during bolus administration of intravenous contrast.  Contrast: OMNIPAQUE IOHEXOL 300 MG/ML  SOLN  Comparison: CT abdomen and pelvis 10/23/2012.  Findings: Small bilateral  pleural effusions have increased in size since the prior study.  There is new dense left basilar airspace disease.  There is some atelectatic change in the right lung base.  Extensive inflammatory change is present about the pancreas.  There is a developing fluid collection anterior to the pancreas is seen as on the comparison study.  Although discrete measurement is difficult, the collection measures approximately 14 cm transverse by 4.8 cm AP by 8.8 cm cranial-caudal.  Perihepatic ascites is again seen with some free fluid again identified in the pelvis. The pancreas enhances homogeneously.  The splenic and portal veins are patent.  There is mild dilatation of small bowel with air-fluid levels present.  Bowel walls appear diffusely thickened and there is mesenteric edema diffusely.  Contrast material is seen in the colon and is likely from the patient's prior CT.  There are a few small gallstones present.  The gallbladder is decompressed.  The liver, adrenal glands and right kidney are unremarkable.  Left renal cyst is noted. The prostate gland is enlarged.  No focal bony abnormality is identified.  IMPRESSION:  1.  Findings consistent with pancreatitis.  Fluid collection anterior to the pancreas is compatible with developing pseudocyst. Negative for evidence of pancreatic necrosis. 2.  Dilatation of small bowel with wall thickening and air-fluid levels likely due to ileus and mesenteric edema. 3.  Small gallstones. 4.  Left renal cyst. 5.  Prostate gland enlargement.   Original Report Authenticated By: Holley Dexter, M.D.     Medications: . heparin  5,000 Units Subcutaneous Q8H  . pantoprazole (PROTONIX) IV  40 mg Intravenous Q24H  . piperacillin-tazobactam (ZOSYN)  IV  3.375 g Intravenous Q8H  . QUEtiapine  400 mg Oral QHS  . sodium chloride  3 mL Intravenous Q12H      Assessment/Plan 1. 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 The gallbladder is currently  contracted with no Cholecystic fluid.  Possible developing pseudocyst of the pancrease. 2. Acute renal failure  3. Hypokalemia and hyponatremia  4. Ileus versus partial small bowel obstruction. No history of surgery.  5. History of hypertension on medication/currently having hypotension.  6. History of schizoaffective disorder, currently well-controlled on medication.  Assessment/Plan Plan;  Continue bowel rest, and hydrate, antibiotics and repeat CT early next week and see if we can proceed with Cholecystectomy.      LOS: 3 days    JENNINGS,WILLARD 10/26/2012

## 2012-10-27 LAB — BASIC METABOLIC PANEL
GFR calc Af Amer: >90 mL/min (ref >90)
GFR calc non Af Amer: 78 mL/min — ABNORMAL LOW (ref >90)
Potassium: 3.4 mEq/L — ABNORMAL LOW (ref 3.5–5.1)
Sodium: 136 mEq/L (ref 135–145)

## 2012-10-27 MED ORDER — PANTOPRAZOLE SODIUM 40 MG PO TBEC
40.0000 mg | DELAYED_RELEASE_TABLET | Freq: Every day | ORAL | Status: DC
  Administered 2012-10-27 – 2012-10-30 (×4): 40 mg via ORAL
  Filled 2012-10-27 (×4): qty 1

## 2012-10-27 MED ORDER — LISINOPRIL 10 MG PO TABS
10.0000 mg | ORAL_TABLET | Freq: Every day | ORAL | Status: DC
  Administered 2012-10-27 – 2012-10-30 (×4): 10 mg via ORAL
  Filled 2012-10-27 (×4): qty 1

## 2012-10-27 MED ORDER — OXYCODONE-ACETAMINOPHEN 5-325 MG PO TABS
1.0000 | ORAL_TABLET | Freq: Four times a day (QID) | ORAL | Status: DC | PRN
  Administered 2012-10-27 (×2): 1 via ORAL
  Administered 2012-10-28 – 2012-10-30 (×5): 2 via ORAL
  Filled 2012-10-27: qty 1
  Filled 2012-10-27 (×2): qty 2
  Filled 2012-10-27: qty 1
  Filled 2012-10-27 (×3): qty 2

## 2012-10-27 NOTE — Progress Notes (Signed)
Utilization review completed.  

## 2012-10-27 NOTE — Progress Notes (Signed)
Patient interviewed and examined, agree with PA note above.  Mariella Saa MD, FACS  10/27/2012 4:13 PM

## 2012-10-27 NOTE — Progress Notes (Signed)
Family Medicine Teaching Service Daily Progress Note Service Page: [phone]  Subjective: Feeling well this am. Tolerating full liquid diet. Reports intermittent epigastric pain, particularly after eating.  Objective: Temp:  [98.4 F (36.9 C)-99.7 F (37.6 C)] 99.7 F (37.6 C) (03/07 0602) Pulse Rate:  [104-130] 130 (03/07 0602) Resp:  [17-18] 17 (03/07 0602) BP: (129-153)/(68-89) 153/89 mmHg (03/07 0602) SpO2:  [94 %-98 %] 94 % (03/07 0602) Weight:  [177 lb 11.2 oz (80.604 kg)] 177 lb 11.2 oz (80.604 kg) (03/06 2040)  Exam: General: resting comfortably in bed. NAD.  Cardiovascular: Tachycardic. Regular rhythm. No murmurs  Respiratory: CTAB. No rales, rhonchi, or wheeze.  Abdomen: soft, slightly TTP - epigastrium, RUQ. Distension improved.  +BS. Extremities: No edema.  CBC BMET   Recent Labs Lab 10/24/12 0640 10/25/12 0604 10/26/12 0857  WBC 11.9* 13.1* 12.4*  HGB 12.3* 12.6* 11.5*  HCT 35.8* 36.2* 33.3*  PLT 226 239 250    Recent Labs Lab 10/24/12 0640 10/25/12 0604 10/26/12 0857  NA 133* 133* 135  K 3.6 3.3* 3.1*  CL 100 100 100  CO2 24 22 26   BUN 35* 18 8  CREATININE 1.58* 1.10 1.15  GLUCOSE 112* 98 128*  CALCIUM 7.8* 8.0* 8.0*     Imaging/Diagnostic Tests:  Ct Abdomen Pelvis W Contrast 10/25/2012   IMPRESSION:  1.  Findings consistent with pancreatitis.  Fluid collection anterior to the pancreas is compatible with developing pseudocyst. Negative for evidence of pancreatic necrosis. 2.  Dilatation of small bowel with wall thickening and air-fluid levels likely due to ileus and mesenteric edema. 3.  Small gallstones. 4.  Left renal cyst. 5.  Prostate gland enlargement.  Ct Abdomen Pelvis Wo Contrast 10/23/2012   IMPRESSION:  1.  Acute pancreatitis with extensive retroperitoneal fluid and inflammatory change.  Pseudocyst are suspected but not clearly characterized without IV contrast. 2.  Secondary inflammation of the proximal jejunum and descending colon  resulting in a diffuse ileus. 3.  Left greater than right basilar airspace disease.  While this may represent atelectasis, infection is not excluded. 4.  Small left pleural effusion. 5.  Small pericardial effusion. 6.  Cystic lesions of the liver are stable. 7.  Stable cystic lesion anteriorly in the left kidney.  US Abdomen Complete 10/23/2012  IMPRESSION:  1.  Findings consistent with pancreatitis.  Fluid collection anterior to the pancreas is compatible with developing pseudocyst. Negative for evidence of pancreatic necrosis. 2.  Dilatation of small bowel with wall thickening and air-fluid levels likely due to ileus and mesenteric edema. 3.  Small gallstones. 4.  Left renal cyst. 5.  Prostate gland enlargement.     Dg Abd Acute W/chest 10/23/2012   IMPRESSION: Abnormal fluid and air filled loops of small intestine in the central abdomen suggesting partial small bowel obstruction.  Assessment/Plan: 52 year old male with PMH of schizoaffective disorder and cholelithiasis presents with acute RUQ abdominal pain and nausea/vomiting; further workup revealed pancreatitis, cholelithiasis, Ileus, and AKI.   # Pancreatitis - Lipase 205 on admission. Likely secondary to biliary source. Patient denied DM, alcohol, new medications, abdominal trauma on admission.  - Surgery following (consulted in the ED) and we greatly appreciate their help.   - Patient tolerating full liquids. - Pain well controlled. Patient switched to PO Percocet today. - Zofran PRN nausea   # Cholelithiasis - Gall bladder contracted on [place] and CT scan. Sludging and calculi noted.  - Likely causing pancreatitis and diffuse bowel inflammation.  Repeat CT abdomen and pelvis  obtained on 3/6.  Possible Surgery this admission ?Marland Kitchen Repeat CT scan on Monday. - Antibiotics D/C'd   # Ileus - resolving.  Passing gas and had BM on 3/5.  # AKI - Patient has baseline normal creatinine. Creatinine 3.65 on admission.  - Now resolved. Creatinine now WNL  (1.15 on 3/6). Awaiting BMP this am.  # HTN  - Restarting home Lisinopril today.  # Schizoaffective disorder  - Continue Seroquel qhs   FEN/GI: NS @ 50 mL/hr PPx: Heparin SQ  Dispo: Per Surgery, patient to continue to be in hospital for repeat CT on Monday. Code Status: Full code   Everlene Other, DO 10/27/2012, 7:39 AM

## 2012-10-27 NOTE — Progress Notes (Signed)
Subjective: Still having some discomfort and a little nausea with some of the full liquids.  I told him to go back to clears if the fulls caused a problem..  Objective: Vital signs in last 24 hours: Temp:  [98.4 F (36.9 C)-99.7 F (37.6 C)] 99.7 F (37.6 C) (03/07 0602) Pulse Rate:  [104-130] 130 (03/07 0602) Resp:  [17-18] 17 (03/07 0602) BP: (129-153)/(68-89) 153/89 mmHg (03/07 0602) SpO2:  [94 %-98 %] 94 % (03/07 0602) Weight:  [177 lb 11.2 oz (80.604 kg)] 177 lb 11.2 oz (80.604 kg) (03/06 2040) Last BM Date: 10/25/12 880 ml PO recorded. Full liquid diet, ongoing low grade time 99.7, K+3.1 No labs today Intake/Output from previous day: 03/06 0701 - 03/07 0700 In: 2593.8 [P.O.:880; I.V.:1563.8; IV Piggyback:150] Out: 100 [Urine:100] Intake/Output this shift:    General appearance: alert, cooperative and no distress Resp: clear to auscultation bilaterally and BS down left base. GI: soft, distended some, still has some pain but not appreciable worse with palpation.  Lab Results:   Recent Labs  10/25/12 0604 10/26/12 0857  WBC 13.1* 12.4*  HGB 12.6* 11.5*  HCT 36.2* 33.3*  PLT 239 250    BMET  Recent Labs  10/25/12 0604 10/26/12 0857  NA 133* 135  K 3.3* 3.1*  CL 100 100  CO2 22 26  GLUCOSE 98 128*  BUN 18 8  CREATININE 1.10 1.15  CALCIUM 8.0* 8.0*   PT/INR No results found for this basename: LABPROT, INR,  in the last 72 hours   Recent Labs Lab 10/23/12 1003 10/24/12 0640 10/25/12 0604 10/26/12 0857  AST 74* 34 32 40*  ALT 100* 54* 43 46  ALKPHOS 99 74 97 97  BILITOT 1.2 0.6 0.5 0.5  PROT 8.0 5.8* 5.9* 5.4*  ALBUMIN 3.3* 2.3* 2.1* 2.0*     Lipase     Component Value Date/Time   LIPASE 251* 10/26/2012 0857     Studies/Results: Ct Abdomen Pelvis W Contrast  10/25/2012  *RADIOLOGY REPORT*  Clinical Data: Abdominal pain, nausea and vomiting.  CT ABDOMEN AND PELVIS WITH CONTRAST  Technique:  Multidetector CT imaging of the abdomen and  pelvis was performed following the standard protocol during bolus administration of intravenous contrast.  Contrast: OMNIPAQUE IOHEXOL 300 MG/ML  SOLN  Comparison: CT abdomen and pelvis 10/23/2012.  Findings: Small bilateral pleural effusions have increased in size since the prior study.  There is new dense left basilar airspace disease.  There is some atelectatic change in the right lung base.  Extensive inflammatory change is present about the pancreas.  There is a developing fluid collection anterior to the pancreas is seen as on the comparison study.  Although discrete measurement is difficult, the collection measures approximately 14 cm transverse by 4.8 cm AP by 8.8 cm cranial-caudal.  Perihepatic ascites is again seen with some free fluid again identified in the pelvis. The pancreas enhances homogeneously.  The splenic and portal veins are patent.  There is mild dilatation of small bowel with air-fluid levels present.  Bowel walls appear diffusely thickened and there is mesenteric edema diffusely.  Contrast material is seen in the colon and is likely from the patient's prior CT.  There are a few small gallstones present.  The gallbladder is decompressed.  The liver, adrenal glands and right kidney are unremarkable.  Left renal cyst is noted. The prostate gland is enlarged.  No focal bony abnormality is identified.  IMPRESSION:  1.  Findings consistent with pancreatitis.  Fluid collection  anterior to the pancreas is compatible with developing pseudocyst. Negative for evidence of pancreatic necrosis. 2.  Dilatation of small bowel with wall thickening and air-fluid levels likely due to ileus and mesenteric edema. 3.  Small gallstones. 4.  Left renal cyst. 5.  Prostate gland enlargement.   Original Report Authenticated By: Holley Dexter, M.D.     Medications: . heparin  5,000 Units Subcutaneous Q8H  . lisinopril  10 mg Oral Daily  . pantoprazole (PROTONIX) IV  40 mg Intravenous Q24H  .  piperacillin-tazobactam (ZOSYN)  IV  3.375 g Intravenous Q8H  . QUEtiapine  400 mg Oral QHS  . sodium chloride  3 mL Intravenous Q12H    Assessment/Plan 1. 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 The gallbladder is currently contracted with no Cholecystic fluid. Possible developing pseudocyst of the pancrease.  2. Acute renal failure  3. Hypokalemia and hyponatremia  4. Ileus versus partial small bowel obstruction. No history of surgery.  5. History of hypertension on medication/currently having hypotension.  6. History of schizoaffective disorder, currently well-controlled on medication.   Plan:  We talked about walking some, IS, continue antibiotics, leave on full liquids as tolerated.  CT scan on Monday. Recheck labs in AM.  LOS: 4 days    Levy,Steve 10/27/2012

## 2012-10-27 NOTE — Progress Notes (Signed)
Seen and examined.  Clinical improvement continues.  We are working with surg for the dispo and timing of surg.

## 2012-10-27 NOTE — Progress Notes (Signed)
PHARMACIST - PHYSICIAN COMMUNICATION DR:   Leveda Anna CONCERNING: Protonix IV to Oral Route Change Policy  RECOMMENDATION: This patient is receiving Protonix by the intravenous route.  Based on criteria approved by the Pharmacy and Therapeutics Committee, this drug is being converted to the equivalent oral dose form(s).  DESCRIPTION: These criteria include:  The patient is eating (either orally or via tube) and/or has been taking other orally administered medications for a least 24 hours  There is no active GI bleed or impaired GI absorption noted.   If you have questions about this conversion, please contact the Pharmacy Department  []   [phone] )  [facility] [x]   [phone] )  [facility]  []   [phone] )  [facility] []   [phone] )  [facility]    Georgina Pillion, PharmD, BCPS 10/27/2012 2:36 PM

## 2012-10-28 LAB — COMPREHENSIVE METABOLIC PANEL
ALT: 91 U/L — ABNORMAL HIGH (ref 0–53)
Alkaline Phosphatase: 114 U/L (ref 39–117)
CO2: 28 mEq/L (ref 19–32)
Chloride: 98 mEq/L (ref 96–112)
GFR calc Af Amer: >90 mL/min (ref >90)
GFR calc non Af Amer: 80 mL/min — ABNORMAL LOW (ref >90)
Glucose, Bld: 96 mg/dL (ref 70–99)
Potassium: 3.2 mEq/L — ABNORMAL LOW (ref 3.5–5.1)
Sodium: 136 mEq/L (ref 135–145)

## 2012-10-28 LAB — CBC
Hemoglobin: 11.7 g/dL — ABNORMAL LOW (ref 13.0–17.0)
RBC: 3.93 MIL/uL — ABNORMAL LOW (ref 4.22–5.81)

## 2012-10-28 MED ORDER — POTASSIUM CHLORIDE CRYS ER 20 MEQ PO TBCR
40.0000 meq | EXTENDED_RELEASE_TABLET | Freq: Two times a day (BID) | ORAL | Status: AC
  Administered 2012-10-28 (×2): 40 meq via ORAL
  Filled 2012-10-28: qty 2

## 2012-10-28 MED ORDER — PIPERACILLIN-TAZOBACTAM 3.375 G IVPB
3.3750 g | Freq: Three times a day (TID) | INTRAVENOUS | Status: DC
  Administered 2012-10-28 – 2012-10-30 (×7): 3.375 g via INTRAVENOUS
  Filled 2012-10-28 (×10): qty 50

## 2012-10-28 NOTE — Progress Notes (Signed)
Seen and examined.  Agree with Dr. Adriana Simas.  We have restarted antibiotics due to a fever spike.  Will plan to continue through CT scan on Monday.

## 2012-10-28 NOTE — Progress Notes (Signed)
Patient ID: Steve Levy, male   DOB: 05-20-1961, 52 y.o.   MRN: [id number]    Subjective: Feels better daily. Occasional mild pain controlled with oral medications. Tolerating his liquid diet without difficulty.  Objective: Vital signs in last 24 hours: Temp:  [97.5 F (36.4 C)-101 F (38.3 C)] 100 F (37.8 C) (03/08 0540) Pulse Rate:  [102-112] 112 (03/08 0507) Resp:  [18] 18 (03/08 0507) BP: (122-137)/(73-85) 131/78 mmHg (03/08 0507) SpO2:  [93 %-95 %] 94 % (03/08 0507) Weight:  [175 lb 11.3 oz (79.7 kg)] 175 lb 11.3 oz (79.7 kg) (03/07 2203) Last BM Date: 10/27/12  Intake/Output from previous day: 03/07 0701 - 03/08 0700 In: 1579.6 [P.O.:360; I.V.:1219.6] Out: -  Intake/Output this shift:    General appearance: alert, cooperative and no distress GI: abdomen is generally softer and less distended. There is mild epigastric tenderness and some fullness or possible mass in the epigastrium.  Lab Results:   Recent Labs  10/26/12 0857 10/28/12 0500  WBC 12.4* 14.9*  HGB 11.5* 11.7*  HCT 33.3* 34.1*  PLT 250 278   BMET  Recent Labs  10/27/12 0823 10/28/12 0500  NA 136 136  K 3.4* 3.2*  CL 99 98  CO2 26 28  GLUCOSE 95 96  BUN 8 7  CREATININE 1.06 1.04  CALCIUM 8.5 8.2*     Studies/Results: No results found.  Anti-infectives: Anti-infectives   Start     Dose/Rate Route Frequency Ordered Stop   10/28/12 0830  piperacillin-tazobactam (ZOSYN) IVPB 3.375 g     3.375 g 12.5 mL/hr over 240 Minutes Intravenous 3 times per day 10/28/12 0805     10/25/12 0900  piperacillin-tazobactam (ZOSYN) IVPB 3.375 g  Status:  Discontinued     3.375 g 12.5 mL/hr over 240 Minutes Intravenous 3 times per day 10/25/12 0818 10/27/12 0845   10/24/12 1200  Ampicillin-Sulbactam (UNASYN) 3 g in sodium chloride 0.9 % 100 mL IVPB  Status:  Discontinued     3 g 100 mL/hr over 60 Minutes Intravenous Every 8 hours 10/24/12 1109 10/25/12 0805   10/24/12 0200  Ampicillin-Sulbactam  (UNASYN) 3 g in sodium chloride 0.9 % 100 mL IVPB  Status:  Discontinued     3 g 100 mL/hr over 60 Minutes Intravenous Every 12 hours 10/23/12 1946 10/24/12 1109   10/23/12 1245  Ampicillin-Sulbactam (UNASYN) 3 g in sodium chloride 0.9 % 100 mL IVPB  Status:  Discontinued     3 g 100 mL/hr over 60 Minutes Intravenous  Once 10/23/12 1230 10/23/12 1236   10/23/12 1245  Ampicillin-Sulbactam (UNASYN) 3 g in sodium chloride 0.9 % 100 mL IVPB     3 g 100 mL/hr over 60 Minutes Intravenous  Once 10/23/12 1237 10/23/12 1450      Assessment/Plan: Severe gallstone pancreatitis. The patient is improving symptomatically but does have a mildly increased white count. He is on antibiotics. Possibly developing pseudocyst. Continue current treatment for now and we will repeat his CT scan on March 10    LOS: 5 days    HOXWORTH,BENJAMIN T 10/28/2012

## 2012-10-28 NOTE — Progress Notes (Signed)
Family Medicine Teaching Service Daily Progress Note Service Page: [phone]  Subjective: Patient noted to be febrile to 101 early this am. Feeling well this am. Reports minimal abdominal pain.  Passing gas and having regular BM's.  Objective: Temp:  [97.5 F (36.4 C)-101 F (38.3 C)] 100 F (37.8 C) (03/08 0540) Pulse Rate:  [102-123] 112 (03/08 0507) Resp:  [18] 18 (03/08 0507) BP: (122-137)/(73-85) 131/78 mmHg (03/08 0507) SpO2:  [93 %-95 %] 94 % (03/08 0507) Weight:  [175 lb 11.3 oz (79.7 kg)] 175 lb 11.3 oz (79.7 kg) (03/07 2203)  Exam: General: resting comfortably in bed. NAD.  Cardiovascular: Tachycardic. Regular rhythm. No murmurs  Respiratory: CTAB. No rales, rhonchi, or wheeze.  Abdomen: soft, slightly TTP - epigastrium, RUQ. Minimal distension.  Extremities: No edema.  CBC BMET   Recent Labs Lab 10/25/12 0604 10/26/12 0857 10/28/12 0500  WBC 13.1* 12.4* 14.9*  HGB 12.6* 11.5* 11.7*  HCT 36.2* 33.3* 34.1*  PLT 239 250 278    Recent Labs Lab 10/26/12 0857 10/27/12 0823 10/28/12 0500  NA 135 136 136  K 3.1* 3.4* 3.2*  CL 100 99 98  CO2 26 26 28   BUN 8 8 7   CREATININE 1.15 1.06 1.04  GLUCOSE 128* 95 96  CALCIUM 8.0* 8.5 8.2*     Imaging/Diagnostic Tests:  Ct Abdomen Pelvis W Contrast 10/25/2012   IMPRESSION:  1.  Findings consistent with pancreatitis.  Fluid collection anterior to the pancreas is compatible with developing pseudocyst. Negative for evidence of pancreatic necrosis. 2.  Dilatation of small bowel with wall thickening and air-fluid levels likely due to ileus and mesenteric edema. 3.  Small gallstones. 4.  Left renal cyst. 5.  Prostate gland enlargement.  Ct Abdomen Pelvis Wo Contrast 10/23/2012   IMPRESSION:  1.  Acute pancreatitis with extensive retroperitoneal fluid and inflammatory change.  Pseudocyst are suspected but not clearly characterized without IV contrast. 2.  Secondary inflammation of the proximal jejunum and descending colon  resulting in a diffuse ileus. 3.  Left greater than right basilar airspace disease.  While this may represent atelectasis, infection is not excluded. 4.  Small left pleural effusion. 5.  Small pericardial effusion. 6.  Cystic lesions of the liver are stable. 7.  Stable cystic lesion anteriorly in the left kidney.  US Abdomen Complete 10/23/2012  IMPRESSION:  1.  Findings consistent with pancreatitis.  Fluid collection anterior to the pancreas is compatible with developing pseudocyst. Negative for evidence of pancreatic necrosis. 2.  Dilatation of small bowel with wall thickening and air-fluid levels likely due to ileus and mesenteric edema. 3.  Small gallstones. 4.  Left renal cyst. 5.  Prostate gland enlargement.     Dg Abd Acute W/chest 10/23/2012   IMPRESSION: Abnormal fluid and air filled loops of small intestine in the central abdomen suggesting partial small bowel obstruction.  Assessment/Plan: 52 year old male with PMH of schizoaffective disorder and cholelithiasis presents with acute RUQ abdominal pain and nausea/vomiting; further workup revealed pancreatitis, cholelithiasis, Ileus, and AKI.   # Pancreatitis - Lipase 205 on admission. Likely secondary to biliary source. Patient denied DM, alcohol, new medications, abdominal trauma on admission.  - Surgery following (consulted in the ED) and we greatly appreciate their help.   - Patient tolerating full liquids. Will advance to soft mechanical today per patient request. - Pain well controlled on PO Percocet. - Will continue zofran PRN for nausea - Will continue to monitor closely during admission.  Patient to go for repeat  CT scan on Monday (see below). - WBC count slightly elevated this am.  In the setting of fever, will restart Zosyn.  # Cholelithiasis - Gall bladder contracted on [place] and CT scan. Sludging and calculi noted.  - Likely causing pancreatitis and diffuse bowel inflammation.  Repeat CT abdomen and pelvis obtained on 3/6.  Possible  Surgery this admission ?Marland Kitchen Repeat CT scan on Monday.  # Ileus - resolved.  Patient passing gas and having frequent bowel movements.  # AKI - Patient has baseline normal creatinine. Creatinine 3.65 on admission.  - Now resolved following aggressive IV fluids. Creatinine 1.04 this am.  # Hypokalemia - K - 3.2 this am. - K-Dur 40 meq x 2 ordered.  # HTN  - Continue home Lisinopril. BP well controlled this am.  # Schizoaffective disorder  - Continue Seroquel qhs   FEN/GI: NS @ 50 mL/hr. Soft mechanical diet today. PPx: Heparin SQ  Dispo: Pending continued improvement; Repeat CT scan planned for Monday in hopes of lack of developing pseudocyst so surgery can proceed with cholecystectomy Code Status: Full code  Everlene Other, DO 10/28/2012, 6:57 AM

## 2012-10-29 LAB — PHOSPHORUS: Phosphorus: 2.8 mg/dL (ref 2.3–4.6)

## 2012-10-29 LAB — CBC
Hemoglobin: 13.1 g/dL (ref 13.0–17.0)
MCH: 30 pg (ref 26.0–34.0)
MCHC: 34 g/dL (ref 30.0–36.0)

## 2012-10-29 LAB — MAGNESIUM: Magnesium: 1.9 mg/dL (ref 1.5–2.5)

## 2012-10-29 LAB — COMPREHENSIVE METABOLIC PANEL
ALT: 105 U/L — ABNORMAL HIGH (ref 0–53)
Calcium: 8.4 mg/dL (ref 8.4–10.5)
GFR calc Af Amer: 84 mL/min — ABNORMAL LOW (ref >90)
Glucose, Bld: 110 mg/dL — ABNORMAL HIGH (ref 70–99)
Sodium: 135 mEq/L (ref 135–145)
Total Protein: 7 g/dL (ref 6.0–8.3)

## 2012-10-29 MED ORDER — POTASSIUM CHLORIDE CRYS ER 20 MEQ PO TBCR
40.0000 meq | EXTENDED_RELEASE_TABLET | Freq: Once | ORAL | Status: AC
  Administered 2012-10-29: 40 meq via ORAL
  Filled 2012-10-29: qty 2

## 2012-10-29 NOTE — Progress Notes (Signed)
Patient interviewed and examined, agree with PA note above.  Mariella Saa MD, FACS  10/29/2012 11:51 AM

## 2012-10-29 NOTE — Progress Notes (Signed)
  Subjective: Tolerating a diet again, some pain but not much.Walking and dressed no real complaints  Objective: Vital signs in last 24 hours: Temp:  [98.1 F (36.7 C)-99.8 F (37.7 C)] 99.8 F (37.7 C) (03/08 2244) Pulse Rate:  [108-120] 120 (03/08 2244) Resp:  [17-18] 17 (03/08 2244) BP: (117-140)/(70-94) 117/70 mmHg (03/08 2244) SpO2:  [94 %-97 %] 94 % (03/08 2244) Weight:  [174 lb 2.6 oz (79 kg)] 174 lb 2.6 oz (79 kg) (03/08 2244) Last BM Date: 10/27/12 DIII diet, afebrile, BP up some, No VSS since 10 PM recorded, WBC is up to 19.7 Intake/Output from previous day: 03/08 0701 - 03/09 0700 In: 2229.2 [P.O.:840; I.V.:1189.2; IV Piggyback:200] Out: -  Intake/Output this shift:    General appearance: alert, cooperative and no distress GI: soft, non-tender; bowel sounds normal; no masses,  no organomegaly  Lab Results:   Recent Labs  10/28/12 0500 10/29/12 0935  WBC 14.9* 19.7*  HGB 11.7* 13.1  HCT 34.1* 38.5*  PLT 278 324    BMET  Recent Labs  10/28/12 0500 10/29/12 0935  NA 136 135  K 3.2* 3.7  CL 98 96  CO2 28 27  GLUCOSE 96 110*  BUN 7 PENDING  CREATININE 1.04 1.13  CALCIUM 8.2* 8.4   PT/INR No results found for this basename: LABPROT, INR,  in the last 72 hours   Recent Labs Lab 10/24/12 0640 10/25/12 0604 10/26/12 0857 10/28/12 0500 10/29/12 0935  AST 34 32 40* 98* 105*  ALT 54* 43 46 91* 105*  ALKPHOS 74 97 97 114 135*  BILITOT 0.6 0.5 0.5 0.5 0.6  PROT 5.8* 5.9* 5.4* 5.9* 7.0  ALBUMIN 2.3* 2.1* 2.0* 2.1* 2.4*     Lipase     Component Value Date/Time   LIPASE 643* 10/28/2012 0500     Studies/Results: No results found.  Medications: . heparin  5,000 Units Subcutaneous Q8H  . lisinopril  10 mg Oral Daily  . pantoprazole  40 mg Oral Daily  . piperacillin-tazobactam (ZOSYN)  IV  3.375 g Intravenous Q8H  . QUEtiapine  400 mg Oral QHS  . sodium chloride  3 mL Intravenous Q12H    Assessment/Plan 1. Recurrent pancreatitis, with  sludge and some calculi within the gallbladder. The gallbladder is currently contracted with no Cholecystic fluid. Possible developing pseudocyst of the pancrease.  2. Acute renal failure  3. Hypokalemia and hyponatremia  4. Ileus versus partial small bowel obstruction. No history of surgery.  5. History of hypertension on medication/currently having hypotension.  6. History of schizoaffective disorder, currently well-controlled on medication.   Plan:  Repeat CT in AM.   LOS: 6 days    JENNINGS,WILLARD 10/29/2012

## 2012-10-29 NOTE — Progress Notes (Signed)
Family Medicine Teaching Service Daily Progress Note Service Page: [phone]  Subjective: Patient doing well with no complaints. Tolerating diet. Passing gas. Remains distended, but overall improved. Afebrile >24 hours, still on Zosyn.  Objective: Temp:  [98.1 F (36.7 C)-99.8 F (37.7 C)] 99.8 F (37.7 C) (03/08 2244) Pulse Rate:  [108-120] 120 (03/08 2244) Resp:  [17-18] 17 (03/08 2244) BP: (117-140)/(70-94) 117/70 mmHg (03/08 2244) SpO2:  [94 %-97 %] 94 % (03/08 2244) Weight:  [174 lb 2.6 oz (79 kg)] 174 lb 2.6 oz (79 kg) (03/08 2244)  Exam: General: reclined in chair, NAD. Cardiovascular: Tachycardic. Regular rhythm. No murmurs  Respiratory: CTAB. No rales, rhonchi, or wheeze.  Abdomen: soft, distended. TTP epigastric and RUQ. No rebound or guarding. No mass apprecaited Extremities: No edema. Neuro: Grossly intact. Moves all extremities  CBC BMET   Recent Labs Lab 10/25/12 0604 10/26/12 0857 10/28/12 0500  WBC 13.1* 12.4* 14.9*  HGB 12.6* 11.5* 11.7*  HCT 36.2* 33.3* 34.1*  PLT 239 250 278    Recent Labs Lab 10/26/12 0857 10/27/12 0823 10/28/12 0500  NA 135 136 136  K 3.1* 3.4* 3.2*  CL 100 99 98  CO2 26 26 28   BUN 8 8 7   CREATININE 1.15 1.06 1.04  GLUCOSE 128* 95 96  CALCIUM 8.0* 8.5 8.2*     Imaging/Diagnostic Tests:  Ct Abdomen Pelvis W Contrast 10/25/2012   IMPRESSION:  1.  Findings consistent with pancreatitis.  Fluid collection anterior to the pancreas is compatible with developing pseudocyst. Negative for evidence of pancreatic necrosis. 2.  Dilatation of small bowel with wall thickening and air-fluid levels likely due to ileus and mesenteric edema. 3.  Small gallstones. 4.  Left renal cyst. 5.  Prostate gland enlargement.  Ct Abdomen Pelvis Wo Contrast 10/23/2012   IMPRESSION:  1.  Acute pancreatitis with extensive retroperitoneal fluid and inflammatory change.  Pseudocyst are suspected but not clearly characterized without IV contrast. 2.  Secondary  inflammation of the proximal jejunum and descending colon resulting in a diffuse ileus. 3.  Left greater than right basilar airspace disease.  While this may represent atelectasis, infection is not excluded. 4.  Small left pleural effusion. 5.  Small pericardial effusion. 6.  Cystic lesions of the liver are stable. 7.  Stable cystic lesion anteriorly in the left kidney.  US Abdomen Complete 10/23/2012  IMPRESSION:  1.  Findings consistent with pancreatitis.  Fluid collection anterior to the pancreas is compatible with developing pseudocyst. Negative for evidence of pancreatic necrosis. 2.  Dilatation of small bowel with wall thickening and air-fluid levels likely due to ileus and mesenteric edema. 3.  Small gallstones. 4.  Left renal cyst. 5.  Prostate gland enlargement.     Dg Abd Acute W/chest 10/23/2012   IMPRESSION: Abnormal fluid and air filled loops of small intestine in the central abdomen suggesting partial small bowel obstruction.  Assessment/Plan: 52 year old male with PMH of schizoaffective disorder and cholelithiasis presents with acute RUQ abdominal pain and nausea/vomiting; further workup revealed pancreatitis, cholelithiasis, Ileus, and AKI.   # Pancreatitis - Lipase 205 on admission. Likely secondary to biliary source. Patient denied DM, alcohol, new medications, abdominal trauma on admission.  - Surgery following (consulted in the ED) and we greatly appreciate their help. Plan to re-evaluate tomorrow for possible surgery. - Patient tolerating soft mechanical diet, NPO after midnight for possible procedure tomorrow - Pain well controlled on PO Percocet. - Will continue zofran PRN for nausea - Will continue to monitor closely  during admission.  Patient to go for repeat CT scan on Monday (see below). - Continue Zosyn - Ambulation today as tolerated  # Cholelithiasis - Gall bladder contracted on [place] and CT scan. Sludging and calculi noted.  - Likely causing pancreatitis and diffuse  bowel inflammation.  Repeat CT abdomen and pelvis 3/10.  Possible Surgery this admission  # Ileus - resolved.  Patient passing gas and having frequent bowel movements.  # AKI - Patient has baseline normal creatinine. Creatinine 3.65 on admission.  - Now resolved following aggressive IV fluids. Creatinine within normal limits.  # Hypokalemia - Persistently low. Given K-Dur 40 meq. WIll dose again today with Kdur x1 - Check Mag level with next lab  # HTN  - Continue home Lisinopril. BP stable  # Schizoaffective disorder  - Continue Seroquel qhs   FEN/GI: NS @ 50 mL/hr. Soft mechanical diet. NPO after midnight PPx: Heparin SQ  Dispo: Pending continued improvement; Repeat CT scan planned for Monday in hopes of lack of developing pseudocyst so surgery can proceed with cholecystectomy Code Status: Full code  Rodman Pickle, MD 10/29/2012, 8:35 AM

## 2012-10-29 NOTE — Progress Notes (Signed)
Seen and examined.  Feels fine.  For CT in am which will determine dispo.

## 2012-10-30 ENCOUNTER — Inpatient Hospital Stay (HOSPITAL_COMMUNITY): Payer: Medicare Other

## 2012-10-30 LAB — COMPREHENSIVE METABOLIC PANEL
ALT: 80 U/L — ABNORMAL HIGH (ref 0–53)
Alkaline Phosphatase: 104 U/L (ref 39–117)
GFR calc Af Amer: 89 mL/min — ABNORMAL LOW (ref >90)
Glucose, Bld: 94 mg/dL (ref 70–99)
Potassium: 4.3 mEq/L (ref 3.5–5.1)
Sodium: 137 mEq/L (ref 135–145)
Total Protein: 6.1 g/dL (ref 6.0–8.3)

## 2012-10-30 LAB — CBC
Hemoglobin: 11.4 g/dL — ABNORMAL LOW (ref 13.0–17.0)
MCHC: 33.1 g/dL (ref 30.0–36.0)
RBC: 3.86 MIL/uL — ABNORMAL LOW (ref 4.22–5.81)
WBC: 14.6 10*3/uL — ABNORMAL HIGH (ref 4.0–10.5)

## 2012-10-30 MED ORDER — IOHEXOL 300 MG/ML  SOLN
80.0000 mL | INTRAMUSCULAR | Status: AC
  Administered 2012-10-30: 80 mL via INTRAVENOUS

## 2012-10-30 MED ORDER — IOHEXOL 300 MG/ML  SOLN
20.0000 mL | INTRAMUSCULAR | Status: AC
  Administered 2012-10-30 (×2): 20 mL via ORAL

## 2012-10-30 MED ORDER — ONDANSETRON HCL 4 MG PO TABS: 4.0000 mg | ORAL_TABLET | Freq: Four times a day (QID) | ORAL | Status: DC | PRN

## 2012-10-30 MED ORDER — OXYCODONE-ACETAMINOPHEN 5-325 MG PO TABS: 1.0000 | ORAL_TABLET | Freq: Four times a day (QID) | ORAL | Status: DC | PRN

## 2012-10-30 NOTE — Progress Notes (Signed)
FMTS Attending Note Patient seen and examined by me, discussed with resident team and I agree with Dr Patsey Berthold assessment and plan for discharge.  Patient is in street clothes and asking when he may leave. Denies abdominal pain, is tolerating a mechanical soft diet.   He is having small bowel movements and passing flatus.  Abdominal exam is soft and without tenderness.  Per surgery's note, patient is to follow up as outpatient and have repeat CT scan in 6 weeks to evaluate for development of pseudocyst formation, consider for cholecystectomy at that time.  He is to return for recurrent abdominal pain, fevers, intolerance of orals, or with other concerns. Paula Compton, MD

## 2012-10-30 NOTE — Progress Notes (Signed)
Family Medicine Teaching Service Daily Progress Note Service Page: [phone]  Subjective: Feeling well this am.  Mild abdominal pain reported but well controlled with Percocet. Reports BM yesterday.   Objective: Temp:  [98.1 F (36.7 C)-99.1 F (37.3 C)] 99.1 F (37.3 C) (03/10 0617) Pulse Rate:  [101-109] 109 (03/10 0617) Resp:  [16-18] 16 (03/10 0617) BP: (130-143)/(78-84) 131/78 mmHg (03/10 0617) SpO2:  [95 %-98 %] 95 % (03/10 0617) Weight:  [171 lb 8.3 oz (77.8 kg)] 171 lb 8.3 oz (77.8 kg) (03/09 2119)  Exam: General: resting comfortably in bed. NAD. Cardiovascular: Tachycardic. Regular rhythm. No murmurs  Respiratory: CTAB. No rales, rhonchi, or wheeze.  Abdomen: soft, nondistended, slightly TTP epigastric and RUQ. No rebound or guarding. No mass apprecaited.   Extremities: No edema appreciated.  CBC BMET   Recent Labs Lab 10/28/12 0500 10/29/12 0935 10/30/12 0543  WBC 14.9* 19.7* 14.6*  HGB 11.7* 13.1 11.4*  HCT 34.1* 38.5* 34.4*  PLT 278 324 305    Recent Labs Lab 10/28/12 0500 10/29/12 0935 10/30/12 0543  NA 136 135 137  K 3.2* 3.7 4.3  CL 98 96 101  CO2 28 27 29   BUN 7 8 9   CREATININE 1.04 1.13 1.08  GLUCOSE 96 110* 94  CALCIUM 8.2* 8.4 8.1*     Imaging/Diagnostic Tests:  Ct Abdomen Pelvis W Contrast 10/25/2012   IMPRESSION:  1.  Findings consistent with pancreatitis.  Fluid collection anterior to the pancreas is compatible with developing pseudocyst. Negative for evidence of pancreatic necrosis. 2.  Dilatation of small bowel with wall thickening and air-fluid levels likely due to ileus and mesenteric edema. 3.  Small gallstones. 4.  Left renal cyst. 5.  Prostate gland enlargement.  Ct Abdomen Pelvis Wo Contrast 10/23/2012   IMPRESSION:  1.  Acute pancreatitis with extensive retroperitoneal fluid and inflammatory change.  Pseudocyst are suspected but not clearly characterized without IV contrast. 2.  Secondary inflammation of the proximal jejunum and  descending colon resulting in a diffuse ileus. 3.  Left greater than right basilar airspace disease.  While this may represent atelectasis, infection is not excluded. 4.  Small left pleural effusion. 5.  Small pericardial effusion. 6.  Cystic lesions of the liver are stable. 7.  Stable cystic lesion anteriorly in the left kidney.  US Abdomen Complete 10/23/2012  IMPRESSION:  1.  Findings consistent with pancreatitis.  Fluid collection anterior to the pancreas is compatible with developing pseudocyst. Negative for evidence of pancreatic necrosis. 2.  Dilatation of small bowel with wall thickening and air-fluid levels likely due to ileus and mesenteric edema. 3.  Small gallstones. 4.  Left renal cyst. 5.  Prostate gland enlargement.     Dg Abd Acute W/chest 10/23/2012   IMPRESSION: Abnormal fluid and air filled loops of small intestine in the central abdomen suggesting partial small bowel obstruction.  Assessment/Plan: 52 year old male with PMH of schizoaffective disorder and cholelithiasis presents with acute RUQ abdominal pain and nausea/vomiting; further workup revealed pancreatitis, cholelithiasis, Ileus, and AKI.   # Pancreatitis - Lipase 205 on admission. Likely secondary to biliary source. Patient denied DM, alcohol, new medications, abdominal trauma on admission.  - Surgery following (consulted in the ED) and we greatly appreciate their help.  - Pain well controlled on PO Percocet. - Will continue zofran PRN for nausea - Will continue to monitor closely during admission.  Patient to have repeat CT today and pending results may go for Cholecystectomy.  Patient currently NPO for possible surgery  today.  # Cholelithiasis - Gall bladder contracted on [place] and CT scan. Sludging and calculi noted.  - Likely causing pancreatitis and diffuse bowel inflammation.  Repeat CT abdomen and pelvis 3/10.  Possible Surgery this admission  # Ileus - resolved.  Patient passing gas and having frequent bowel  movements.  # AKI - Patient has baseline normal creatinine. Creatinine 3.65 on admission.  - Now resolved following aggressive IV fluids. Creatinine within normal limits.  # Hypokalemia - Resolved. K+ - 4.3 this am.  # HTN  - Currently stable. - Continue home Lisinopril 10 mg.   # Schizoaffective disorder  - Continue Seroquel qhs   FEN/GI: NS @ 50 mL/hr. Currently NPO PPx: Heparin SQ  Dispo: Pending continued improvement; Repeat CT scan today in hopes of lack of developing pseudocyst so surgery can proceed with cholecystectomy Code Status: Full code  Everlene Other, DO 10/30/2012, 7:10 AM

## 2012-10-30 NOTE — Progress Notes (Signed)
Patient ID: Steve Levy, male   DOB: 27-Dec-1960, 52 y.o.   MRN: [id number]    Subjective: Pt feels ok.  Minimal tenderness.  Tolerating dysphagia diet.  Objective: Vital signs in last 24 hours: Temp:  [98.1 F (36.7 C)-99.1 F (37.3 C)] 99.1 F (37.3 C) (03/10 0617) Pulse Rate:  [101-109] 109 (03/10 0617) Resp:  [16-18] 16 (03/10 0617) BP: (130-143)/(78-84) 131/78 mmHg (03/10 0617) SpO2:  [95 %-98 %] 95 % (03/10 0617) Weight:  [171 lb 8.3 oz (77.8 kg)] 171 lb 8.3 oz (77.8 kg) (03/09 2119) Last BM Date: 10/27/12  Intake/Output from previous day: 03/09 0701 - 03/10 0700 In: 2435.8 [P.O.:480; I.V.:1205.8; IV Piggyback:150] Out: -  Intake/Output this shift:    PE: Abd: soft, minimally tenderness in upper abdomen, +BS, mild fullness in upper abdomen Heart: regular Lungs: CTAB  Lab Results:   Recent Labs  10/29/12 0935 10/30/12 0543  WBC 19.7* 14.6*  HGB 13.1 11.4*  HCT 38.5* 34.4*  PLT 324 305   BMET  Recent Labs  10/29/12 0935 10/30/12 0543  NA 135 137  K 3.7 4.3  CL 96 101  CO2 27 29  GLUCOSE 110* 94  BUN 8 9  CREATININE 1.13 1.08  CALCIUM 8.4 8.1*   PT/INR No results found for this basename: LABPROT, INR,  in the last 72 hours CMP     Component Value Date/Time   NA 137 10/30/2012 0543   K 4.3 10/30/2012 0543   CL 101 10/30/2012 0543   CO2 29 10/30/2012 0543   GLUCOSE 94 10/30/2012 0543   BUN 9 10/30/2012 0543   CREATININE 1.08 10/30/2012 0543   CALCIUM 8.1* 10/30/2012 0543   PROT 6.1 10/30/2012 0543   ALBUMIN 2.1* 10/30/2012 0543   AST 72* 10/30/2012 0543   ALT 80* 10/30/2012 0543   ALKPHOS 104 10/30/2012 0543   BILITOT 0.3 10/30/2012 0543   GFRNONAA 77* 10/30/2012 0543   GFRAA 89* 10/30/2012 0543   Lipase     Component Value Date/Time   LIPASE 643* 10/28/2012 0500       Studies/Results: No results found.  Anti-infectives: Anti-infectives   Start     Dose/Rate Route Frequency Ordered Stop   10/28/12 0830  piperacillin-tazobactam (ZOSYN) IVPB  3.375 g     3.375 g 12.5 mL/hr over 240 Minutes Intravenous 3 times per day 10/28/12 0805     10/25/12 0900  piperacillin-tazobactam (ZOSYN) IVPB 3.375 g  Status:  Discontinued     3.375 g 12.5 mL/hr over 240 Minutes Intravenous 3 times per day 10/25/12 0818 10/27/12 0845   10/24/12 1200  Ampicillin-Sulbactam (UNASYN) 3 g in sodium chloride 0.9 % 100 mL IVPB  Status:  Discontinued     3 g 100 mL/hr over 60 Minutes Intravenous Every 8 hours 10/24/12 1109 10/25/12 0805   10/24/12 0200  Ampicillin-Sulbactam (UNASYN) 3 g in sodium chloride 0.9 % 100 mL IVPB  Status:  Discontinued     3 g 100 mL/hr over 60 Minutes Intravenous Every 12 hours 10/23/12 1946 10/24/12 1109   10/23/12 1245  Ampicillin-Sulbactam (UNASYN) 3 g in sodium chloride 0.9 % 100 mL IVPB  Status:  Discontinued     3 g 100 mL/hr over 60 Minutes Intravenous  Once 10/23/12 1230 10/23/12 1236   10/23/12 1245  Ampicillin-Sulbactam (UNASYN) 3 g in sodium chloride 0.9 % 100 mL IVPB     3 g 100 mL/hr over 60 Minutes Intravenous  Once 10/23/12 1237 10/23/12 1450  Assessment/Plan  1. Gallstone pancreatitis  Plan: 1. Repeat CT scan today to determine whether the peripancreatic inflammation has improved or worsen.  This will determine whether he can proceed with surgical intervention this stay or as an outpatient.   LOS: 7 days    OSBORNE,KELLY E 10/30/2012, 9:18 AM Pager: [phone]

## 2012-10-30 NOTE — Progress Notes (Signed)
I have seen adn examined the patient and agree with PA-Osborne's note CT reveals multilobed pseudocyst. From our standpoint pt ok for PO If tolerated can f/u with Dr. Johna Sheriff in 2 weeks. He will needs f/u CT in 6 weeks to eval the maturation vs resolution of pseudocyst and possible surgery and cholecystectomy.

## 2012-10-30 NOTE — Progress Notes (Signed)
Patient was discharged home with discharge instructions and prescriptions. Patient was given information on pancreatitis. Patient was told to contact the doctor if signs of increased pain or infection. Patient was stable upon discharge. Patient independently walked off unit. Help was offered but patient declined.

## 2012-10-31 NOTE — Discharge Summary (Signed)
Reviewed:  Agree with DC and with Dr. Patsey Berthold management and documentation.

## 2012-12-13 ENCOUNTER — Encounter (INDEPENDENT_AMBULATORY_CARE_PROVIDER_SITE_OTHER): Payer: Self-pay | Admitting: General Surgery

## 2012-12-13 ENCOUNTER — Ambulatory Visit (INDEPENDENT_AMBULATORY_CARE_PROVIDER_SITE_OTHER): Payer: Medicare Other | Admitting: General Surgery

## 2012-12-13 VITALS — BP 122/74 | HR 70 | Temp 99.9°F | Resp 16 | Ht 67.0 in | Wt 167.0 lb

## 2012-12-13 DIAGNOSIS — K859 Acute pancreatitis without necrosis or infection, unspecified: Secondary | ICD-10-CM

## 2012-12-13 DIAGNOSIS — K851 Biliary acute pancreatitis without necrosis or infection: Secondary | ICD-10-CM

## 2012-12-13 DIAGNOSIS — K862 Cyst of pancreas: Secondary | ICD-10-CM

## 2012-12-13 DIAGNOSIS — K863 Pseudocyst of pancreas: Secondary | ICD-10-CM

## 2012-12-13 NOTE — Progress Notes (Signed)
Chief complaint: Followup gallstone pancreatitis and pancreatic pseudocyst  History: Patient returns to the office after a hospitalization approximately 2 months ago for severe acute gallstone pancreatitis. The patient was hospitalized for about 2 weeks and on CT scan just prior to discharge he still had a large organizing pancreatic pseudocyst. Since discharge she clinically has done quite well. He does notice some aching pain after eating it radiates up into his left chest. This was not severe. He is able to be without nausea or vomiting. No fever or chills. No anterior abdominal pain.  Past Medical History  Diagnosis Date  . Chronic schizoaffective disorder   . Renal cyst   . Pancreatic pseudocyst 2014   No past surgical history on file. Current Outpatient Prescriptions  Medication Sig Dispense Refill  . divalproex (DEPAKOTE ER) 250 MG 24 hr tablet       . HYDROcodone-acetaminophen (NORCO/VICODIN) 5-325 MG per tablet       . lisinopril (PRINIVIL,ZESTRIL) 10 MG tablet Take 10 mg by mouth daily.      . ondansetron (ZOFRAN) 4 MG tablet Take 1 tablet (4 mg total) by mouth every 6 (six) hours as needed for nausea.  20 tablet  0  . oxyCODONE-acetaminophen (PERCOCET/ROXICET) 5-325 MG per tablet Take 1-2 tablets by mouth every 6 (six) hours as needed.  30 tablet  0  . QUEtiapine (SEROQUEL) 400 MG tablet Take 400 mg by mouth at bedtime.      Marland Kitchen QUEtiapine (SEROQUEL) 400 MG tablet Take 400 mg by mouth at bedtime as needed. In addition to scheduled 400 mg po qHS dose.       No current facility-administered medications for this visit.   No Known Allergies History  Substance Use Topics  . Smoking status: Never Smoker   . Smokeless tobacco: Not on file  . Alcohol Use: 0.6 oz/week    1 Cans of beer per week   Exam: BP 122/74  Pulse 70  Temp(Src) 99.9 F (37.7 C) (Temporal)  Resp 16  Ht 5\' 7"  (1.702 m)  Wt 167 lb (75.751 kg)  BMI 26.15 kg/m2 General: Well-appearing Caucasian male Skin: No  rash or infection Lungs: Clear equal breath sounds without increased work of breathing Cardiac: Regular rate and rhythm. No edema Abdomen: Soft and nontender. I cannot feel any fullness or mass in the upper abdomen as I could prior to his discharge. No organomegaly. No hernias. Extremities: No edema or joint swelling Neurologic: He is alert and fully oriented. Affect normal.  Assessment and plan: Status post episode of severe acute gallstone pancreatitis with evolving pseudocysts on CT scan 6 weeks ago. He clinically is much improved. I would repeat his CT scan. If there has been significant improvement in the pseudocyst I believe we can proceed with laparoscopic cholecystectomy with cholangiogram to prevent further episodes of pancreatitis. This was all discussed with the patient. I will call him with the results of the CT scan. We discussed laparoscopic cholecystectomy including indications and risks.I discussed the procedure in detail.  The patient was given Agricultural engineer.  We discussed the risks and benefits of a laparoscopic cholecystectomy and possible cholangiogram including, but not limited to bleeding, infection, injury to surrounding structures such as the intestine or liver, bile leak, retained gallstones, need to convert to an open procedure, prolonged diarrhea, blood clots such as  DVT, common bile duct injury, anesthesia risks, and possible need for additional procedures.  The likelihood of improvement in symptoms and return to the patient's normal status is  good. We discussed the typical post-operative recovery course. I will call him with the results of the CT scan and hopefully we can get his cholecystectomy scheduled.

## 2012-12-15 ENCOUNTER — Ambulatory Visit
Admission: RE | Admit: 2012-12-15 | Discharge: 2012-12-15 | Disposition: A | Discharge status: Home / Self Care | Payer: Medicare Other | Source: Ambulatory Visit | Attending: General Surgery | Admitting: General Surgery

## 2012-12-15 DIAGNOSIS — K851 Biliary acute pancreatitis without necrosis or infection: Secondary | ICD-10-CM

## 2012-12-15 DIAGNOSIS — K863 Pseudocyst of pancreas: Secondary | ICD-10-CM

## 2012-12-15 MED ORDER — IOHEXOL 300 MG/ML  SOLN
100.0000 mL | Freq: Once | INTRAMUSCULAR | Status: AC | PRN
  Administered 2012-12-15: 100 mL via INTRAVENOUS

## 2012-12-19 ENCOUNTER — Encounter (HOSPITAL_COMMUNITY): Payer: Self-pay | Admitting: Pharmacy Technician

## 2012-12-19 ENCOUNTER — Telehealth (INDEPENDENT_AMBULATORY_CARE_PROVIDER_SITE_OTHER): Payer: Self-pay | Admitting: General Surgery

## 2012-12-19 ENCOUNTER — Other Ambulatory Visit (INDEPENDENT_AMBULATORY_CARE_PROVIDER_SITE_OTHER): Payer: Self-pay | Admitting: General Surgery

## 2012-12-19 NOTE — Telephone Encounter (Signed)
Called pt to discuss CT results, will schedule for cholecystectomy

## 2012-12-20 NOTE — Patient Instructions (Addendum)
20 GABOR LUSK  12/20/2012   Your procedure is scheduled on: 12/25/12  Report to [facility] at 1145 AM.  Call this number if you have problems the morning of surgery 336-: [phone]   Remember:   Do not eat food After Midnight on 12/24/12, clear liquids from midnight until 0815am on 12/25/12 then nothing.    Do not wear jewelry, make-up or nail polish.  Do not wear lotions, powders, or perfumes. You may wear deodorant.  Do not shave 48 hours prior to surgery. Men may shave face and neck.  Do not bring valuables to the hospital.  Contacts, dentures or bridgework may not be worn into surgery.   Patients discharged the day of surgery will not be allowed to drive home.  Name and phone number of your driver: Standley Dakins [phone]     Please read over the following fact sheets that you were given: MRSA Information, clear liquids fact sheet  Birdie Sons, RN  pre op nurse call if needed [phone]    FAILURE TO FOLLOW THESE INSTRUCTIONS MAY RESULT IN CANCELLATION OF YOUR SURGERY   Patient Signature: ___________________________________________

## 2012-12-21 ENCOUNTER — Ambulatory Visit (HOSPITAL_COMMUNITY)
Admission: RE | Admit: 2012-12-21 | Discharge: 2012-12-21 | Disposition: A | Discharge status: Home / Self Care | Payer: Medicare Other | Source: Ambulatory Visit | Attending: General Surgery | Admitting: General Surgery

## 2012-12-21 ENCOUNTER — Encounter (HOSPITAL_COMMUNITY): Payer: Self-pay

## 2012-12-21 ENCOUNTER — Encounter (HOSPITAL_COMMUNITY)
Admission: RE | Admit: 2012-12-21 | Discharge: 2012-12-21 | Disposition: A | Discharge status: Home / Self Care | Payer: Medicare Other | Source: Ambulatory Visit | Attending: General Surgery | Admitting: General Surgery

## 2012-12-21 DIAGNOSIS — I10 Essential (primary) hypertension: Secondary | ICD-10-CM | POA: Insufficient documentation

## 2012-12-21 DIAGNOSIS — K8010 Calculus of gallbladder with chronic cholecystitis without obstruction: Secondary | ICD-10-CM | POA: Insufficient documentation

## 2012-12-21 DIAGNOSIS — K8689 Other specified diseases of pancreas: Secondary | ICD-10-CM | POA: Insufficient documentation

## 2012-12-21 DIAGNOSIS — Z01812 Encounter for preprocedural laboratory examination: Secondary | ICD-10-CM | POA: Insufficient documentation

## 2012-12-21 DIAGNOSIS — Z01818 Encounter for other preprocedural examination: Secondary | ICD-10-CM | POA: Insufficient documentation

## 2012-12-21 HISTORY — DX: Essential (primary) hypertension: I10

## 2012-12-21 LAB — CBC
HCT: 47.1 % (ref 39.0–52.0)
MCV: 86.7 fL (ref 78.0–100.0)
Platelets: 254 10*3/uL (ref 150–400)
RBC: 5.43 MIL/uL (ref 4.22–5.81)
RDW: 14 % (ref 11.5–15.5)
WBC: 5.9 10*3/uL (ref 4.0–10.5)

## 2012-12-21 LAB — BASIC METABOLIC PANEL
BUN: 16 mg/dL (ref 6–23)
CO2: 31 mEq/L (ref 19–32)
Chloride: 100 mEq/L (ref 96–112)
Creatinine, Ser: 1.14 mg/dL (ref 0.50–1.35)
GFR calc Af Amer: 83 mL/min — ABNORMAL LOW (ref >90)
Potassium: 4.4 mEq/L (ref 3.5–5.1)

## 2012-12-21 LAB — SURGICAL PCR SCREEN: Staphylococcus aureus: NEGATIVE

## 2012-12-21 NOTE — Progress Notes (Signed)
EKG 11/03/12 on EPIC

## 2012-12-25 ENCOUNTER — Encounter (HOSPITAL_COMMUNITY): Payer: Self-pay | Admitting: *Deleted

## 2012-12-25 ENCOUNTER — Ambulatory Visit (HOSPITAL_COMMUNITY)
Admission: RE | Admit: 2012-12-25 | Discharge: 2012-12-25 | Disposition: A | Discharge status: Home / Self Care | Payer: Medicare Other | Source: Ambulatory Visit | Attending: General Surgery | Admitting: General Surgery

## 2012-12-25 ENCOUNTER — Encounter (HOSPITAL_COMMUNITY)
Admission: RE | Disposition: A | Discharge status: Home / Self Care | Payer: Self-pay | Source: Ambulatory Visit | Attending: General Surgery

## 2012-12-25 ENCOUNTER — Ambulatory Visit (HOSPITAL_COMMUNITY): Payer: Medicare Other | Admitting: Anesthesiology

## 2012-12-25 ENCOUNTER — Ambulatory Visit (HOSPITAL_COMMUNITY): Payer: Medicare Other

## 2012-12-25 ENCOUNTER — Encounter (HOSPITAL_COMMUNITY): Payer: Self-pay | Admitting: Anesthesiology

## 2012-12-25 DIAGNOSIS — F259 Schizoaffective disorder, unspecified: Secondary | ICD-10-CM | POA: Insufficient documentation

## 2012-12-25 DIAGNOSIS — K8066 Calculus of gallbladder and bile duct with acute and chronic cholecystitis without obstruction: Secondary | ICD-10-CM

## 2012-12-25 DIAGNOSIS — Q619 Cystic kidney disease, unspecified: Secondary | ICD-10-CM | POA: Insufficient documentation

## 2012-12-25 DIAGNOSIS — K8000 Calculus of gallbladder with acute cholecystitis without obstruction: Secondary | ICD-10-CM | POA: Insufficient documentation

## 2012-12-25 DIAGNOSIS — K863 Pseudocyst of pancreas: Secondary | ICD-10-CM | POA: Insufficient documentation

## 2012-12-25 DIAGNOSIS — K8010 Calculus of gallbladder with chronic cholecystitis without obstruction: Secondary | ICD-10-CM | POA: Insufficient documentation

## 2012-12-25 DIAGNOSIS — K862 Cyst of pancreas: Secondary | ICD-10-CM | POA: Insufficient documentation

## 2012-12-25 DIAGNOSIS — K859 Acute pancreatitis without necrosis or infection, unspecified: Secondary | ICD-10-CM

## 2012-12-25 DIAGNOSIS — K8020 Calculus of gallbladder without cholecystitis without obstruction: Secondary | ICD-10-CM

## 2012-12-25 HISTORY — PX: CHOLECYSTECTOMY: SHX55

## 2012-12-25 SURGERY — LAPAROSCOPIC CHOLECYSTECTOMY WITH INTRAOPERATIVE CHOLANGIOGRAM
Anesthesia: General | Site: Abdomen | Wound class: Clean Contaminated

## 2012-12-25 MED ORDER — LACTATED RINGERS IV SOLN
INTRAVENOUS | Status: DC
  Administered 2012-12-25 (×2): via INTRAVENOUS

## 2012-12-25 MED ORDER — LACTATED RINGERS IV SOLN: INTRAVENOUS | Status: DC

## 2012-12-25 MED ORDER — PROPOFOL 10 MG/ML IV BOLUS
INTRAVENOUS | Status: DC | PRN
  Administered 2012-12-25: 160 mg via INTRAVENOUS

## 2012-12-25 MED ORDER — ACETAMINOPHEN 10 MG/ML IV SOLN
INTRAVENOUS | Status: DC | PRN
  Administered 2012-12-25: 1000 mg via INTRAVENOUS

## 2012-12-25 MED ORDER — BUPIVACAINE-EPINEPHRINE 0.25% -1:200000 IJ SOLN
INTRAMUSCULAR | Status: DC | PRN
  Administered 2012-12-25: 15 mL

## 2012-12-25 MED ORDER — NEOSTIGMINE METHYLSULFATE 1 MG/ML IJ SOLN
INTRAMUSCULAR | Status: DC | PRN
  Administered 2012-12-25: 4 mg via INTRAVENOUS

## 2012-12-25 MED ORDER — LIDOCAINE HCL (CARDIAC) 20 MG/ML IV SOLN
INTRAVENOUS | Status: DC | PRN
  Administered 2012-12-25: 100 mg via INTRAVENOUS

## 2012-12-25 MED ORDER — CEFAZOLIN SODIUM 1-5 GM-% IV SOLN
INTRAVENOUS | Status: AC
  Filled 2012-12-25: qty 50

## 2012-12-25 MED ORDER — CEFAZOLIN SODIUM-DEXTROSE 2-3 GM-% IV SOLR
2.0000 g | INTRAVENOUS | Status: AC
  Administered 2012-12-25: 2 g via INTRAVENOUS

## 2012-12-25 MED ORDER — MEPERIDINE HCL 50 MG/ML IJ SOLN: 6.2500 mg | INTRAMUSCULAR | Status: DC | PRN

## 2012-12-25 MED ORDER — IOHEXOL 300 MG/ML  SOLN
INTRAMUSCULAR | Status: AC
  Filled 2012-12-25: qty 1

## 2012-12-25 MED ORDER — SUCCINYLCHOLINE CHLORIDE 20 MG/ML IJ SOLN
INTRAMUSCULAR | Status: DC | PRN
  Administered 2012-12-25: 80 mg via INTRAVENOUS

## 2012-12-25 MED ORDER — OXYCODONE-ACETAMINOPHEN 5-325 MG PO TABS: 1.0000 | ORAL_TABLET | ORAL | Status: DC | PRN

## 2012-12-25 MED ORDER — BUPIVACAINE-EPINEPHRINE 0.25% -1:200000 IJ SOLN
INTRAMUSCULAR | Status: AC
  Filled 2012-12-25: qty 1

## 2012-12-25 MED ORDER — IOHEXOL 300 MG/ML  SOLN
INTRAMUSCULAR | Status: DC | PRN
  Administered 2012-12-25: 3.5 mL

## 2012-12-25 MED ORDER — HYDROMORPHONE HCL PF 1 MG/ML IJ SOLN
INTRAMUSCULAR | Status: AC
  Filled 2012-12-25: qty 1

## 2012-12-25 MED ORDER — LACTATED RINGERS IR SOLN
Status: DC | PRN
  Administered 2012-12-25: 1000 mL

## 2012-12-25 MED ORDER — DEXAMETHASONE SODIUM PHOSPHATE 10 MG/ML IJ SOLN
INTRAMUSCULAR | Status: DC | PRN
  Administered 2012-12-25: 10 mg via INTRAVENOUS

## 2012-12-25 MED ORDER — BUPIVACAINE-EPINEPHRINE 0.5% -1:200000 IJ SOLN
INTRAMUSCULAR | Status: AC
  Filled 2012-12-25: qty 1

## 2012-12-25 MED ORDER — MIDAZOLAM HCL 5 MG/5ML IJ SOLN
INTRAMUSCULAR | Status: DC | PRN
  Administered 2012-12-25: 2 mg via INTRAVENOUS

## 2012-12-25 MED ORDER — HYDROMORPHONE HCL PF 1 MG/ML IJ SOLN
0.2500 mg | INTRAMUSCULAR | Status: DC | PRN
  Administered 2012-12-25 (×2): 0.5 mg via INTRAVENOUS

## 2012-12-25 MED ORDER — CEFAZOLIN SODIUM-DEXTROSE 2-3 GM-% IV SOLR
INTRAVENOUS | Status: AC
  Filled 2012-12-25: qty 50

## 2012-12-25 MED ORDER — ACETAMINOPHEN 10 MG/ML IV SOLN
INTRAVENOUS | Status: AC
  Filled 2012-12-25: qty 100

## 2012-12-25 MED ORDER — CISATRACURIUM BESYLATE (PF) 10 MG/5ML IV SOLN
INTRAVENOUS | Status: DC | PRN
  Administered 2012-12-25: 5 mg via INTRAVENOUS

## 2012-12-25 MED ORDER — GLUCAGON HCL (RDNA) 1 MG IJ SOLR
INTRAMUSCULAR | Status: AC
  Filled 2012-12-25: qty 1

## 2012-12-25 MED ORDER — GLYCOPYRROLATE 0.2 MG/ML IJ SOLN
INTRAMUSCULAR | Status: DC | PRN
  Administered 2012-12-25: .6 mg via INTRAVENOUS

## 2012-12-25 MED ORDER — PROMETHAZINE HCL 25 MG/ML IJ SOLN: 6.2500 mg | INTRAMUSCULAR | Status: DC | PRN

## 2012-12-25 MED ORDER — FENTANYL CITRATE 0.05 MG/ML IJ SOLN
INTRAMUSCULAR | Status: DC | PRN
  Administered 2012-12-25: 50 ug via INTRAVENOUS
  Administered 2012-12-25: 150 ug via INTRAVENOUS
  Administered 2012-12-25: 50 ug via INTRAVENOUS

## 2012-12-25 MED ORDER — ONDANSETRON HCL 4 MG/2ML IJ SOLN
INTRAMUSCULAR | Status: DC | PRN
  Administered 2012-12-25: 4 mg via INTRAVENOUS

## 2012-12-25 MED ORDER — FENTANYL CITRATE 0.05 MG/ML IJ SOLN: 25.0000 ug | INTRAMUSCULAR | Status: DC | PRN

## 2012-12-25 SURGICAL SUPPLY — 53 items
ADH SKN CLS APL DERMABOND .7 (GAUZE/BANDAGES/DRESSINGS) ×1
APL SKNCLS STERI-STRIP NONHPOA (GAUZE/BANDAGES/DRESSINGS)
APPLIER CLIP ROT 10 11.4 M/L (STAPLE) ×2
APR CLP MED LRG 11.4X10 (STAPLE) ×1
BAG SPEC RTRVL LRG 6X4 10 (ENDOMECHANICALS) ×1
BENZOIN TINCTURE PRP APPL 2/3 (GAUZE/BANDAGES/DRESSINGS) IMPLANT
CANISTER SUCTION 2500CC (MISCELLANEOUS) ×2 IMPLANT
CATH REDDICK CHOLANGI 4FR 50CM (CATHETERS) IMPLANT
CHLORAPREP W/TINT 26ML (MISCELLANEOUS) ×2 IMPLANT
CLIP APPLIE ROT 10 11.4 M/L (STAPLE) ×1 IMPLANT
CLOTH BEACON ORANGE TIMEOUT ST (SAFETY) ×2 IMPLANT
COVER MAYO STAND STRL (DRAPES) ×2 IMPLANT
DECANTER SPIKE VIAL GLASS SM (MISCELLANEOUS) ×2 IMPLANT
DERMABOND ADVANCED (GAUZE/BANDAGES/DRESSINGS) ×1
DERMABOND ADVANCED .7 DNX12 (GAUZE/BANDAGES/DRESSINGS) ×1 IMPLANT
DRAPE C-ARM 42X72 X-RAY (DRAPES) ×2 IMPLANT
DRAPE LAPAROSCOPIC ABDOMINAL (DRAPES) ×2 IMPLANT
ELECT REM PT RETURN 9FT ADLT (ELECTROSURGICAL) ×2
ELECTRODE REM PT RTRN 9FT ADLT (ELECTROSURGICAL) ×1 IMPLANT
GLOVE BIOGEL M 8.0 STRL (GLOVE) ×2 IMPLANT
GLOVE BIOGEL PI IND STRL 6 (GLOVE) ×1 IMPLANT
GLOVE BIOGEL PI IND STRL 7.0 (GLOVE) IMPLANT
GLOVE BIOGEL PI INDICATOR 6 (GLOVE) ×1
GLOVE BIOGEL PI INDICATOR 7.0 (GLOVE)
GLOVE SS BIOGEL STRL SZ 7.5 (GLOVE) ×1 IMPLANT
GLOVE SUPERSENSE BIOGEL SZ 7.5 (GLOVE) ×1
GLOVE SURG SS PI 6.5 STRL IVOR (GLOVE) ×2 IMPLANT
GLOVE SURG SS PI 8.5 STRL IVOR (GLOVE) ×1
GLOVE SURG SS PI 8.5 STRL STRW (GLOVE) ×1 IMPLANT
GOWN STRL NON-REIN LRG LVL3 (GOWN DISPOSABLE) ×2 IMPLANT
GOWN STRL REIN 2XL XLG LVL4 (GOWN DISPOSABLE) ×2 IMPLANT
GOWN STRL REIN XL XLG (GOWN DISPOSABLE) ×4 IMPLANT
HEMOSTAT SURGICEL 4X8 (HEMOSTASIS) IMPLANT
IV CATH 14GX2 1/4 (CATHETERS) IMPLANT
IV LACTATED RINGERS 1000ML (IV SOLUTION) ×2 IMPLANT
IV SET EXT 30 76VOL 4 MALE LL (IV SETS) IMPLANT
KIT BASIN OR (CUSTOM PROCEDURE TRAY) ×2 IMPLANT
NS IRRIG 1000ML POUR BTL (IV SOLUTION) ×2 IMPLANT
POUCH SPECIMEN RETRIEVAL 10MM (ENDOMECHANICALS) ×2 IMPLANT
SET CHOLANGIOGRAPH MIX (MISCELLANEOUS) ×2 IMPLANT
SET IRRIG TUBING LAPAROSCOPIC (IRRIGATION / IRRIGATOR) ×2 IMPLANT
SOLUTION ANTI FOG 6CC (MISCELLANEOUS) ×2 IMPLANT
STOPCOCK K 69 2C6206 (IV SETS) IMPLANT
STRIP CLOSURE SKIN 1/2X4 (GAUZE/BANDAGES/DRESSINGS) IMPLANT
SUT MNCRL AB 4-0 PS2 18 (SUTURE) ×2 IMPLANT
TOWEL OR 17X26 10 PK STRL BLUE (TOWEL DISPOSABLE) ×2 IMPLANT
TRAY LAP CHOLE (CUSTOM PROCEDURE TRAY) ×2 IMPLANT
TROCAR SLEEVE XCEL 5X75 (ENDOMECHANICALS) ×2 IMPLANT
TROCAR XCEL BLADELESS 5X75MML (TROCAR) ×2 IMPLANT
TROCAR XCEL BLUNT TIP 100MML (ENDOMECHANICALS) ×2 IMPLANT
TROCAR XCEL NON-BLD 11X100MML (ENDOMECHANICALS) ×2 IMPLANT
TROCAR Z-THREAD FIOS 5X100MM (TROCAR) IMPLANT
TUBING INSUFFLATION 10FT LAP (TUBING) ×2 IMPLANT

## 2012-12-25 NOTE — Anesthesia Postprocedure Evaluation (Signed)
  Anesthesia Post-op Note  Patient: Steve Levy  Procedure(s) Performed: Procedure(s) (LRB): LAPAROSCOPIC CHOLECYSTECTOMY WITH INTRAOPERATIVE CHOLANGIOGRAM (N/A)  Patient Location: PACU  Anesthesia Type: General  Level of Consciousness: awake and alert   Airway and Oxygen Therapy: Patient Spontanous Breathing  Post-op Pain: mild  Post-op Assessment: Post-op Vital signs reviewed, Patient's Cardiovascular Status Stable, Respiratory Function Stable, Patent Airway and No signs of Nausea or vomiting  Last Vitals:  Filed Vitals:   12/25/12 1940  BP: 144/82  Pulse: 91  Temp:   Resp: 16    Post-op Vital Signs: stable   Complications: No apparent anesthesia complications

## 2012-12-25 NOTE — Preoperative (Signed)
Beta Blockers   Reason not to administer Beta Blockers:Not Applicable 

## 2012-12-25 NOTE — Interval H&P Note (Signed)
History and Physical Interval Note:  12/25/2012 4:15 PM  Steve Levy  has presented today for surgery, with the diagnosis of gallstone/pancreatitis  The various methods of treatment have been discussed with the patient and family. After consideration of risks, benefits and other options for treatment, the patient has consented to  Procedure(s): LAPAROSCOPIC CHOLECYSTECTOMY WITH INTRAOPERATIVE CHOLANGIOGRAM (N/A) as a surgical intervention .  The patient's history has been reviewed, patient examined, no change in status, stable for surgery.  I have reviewed the patient's chart and labs.  Questions were answered to the patient's satisfaction.     Dennice Tindol T

## 2012-12-25 NOTE — Transfer of Care (Signed)
Immediate Anesthesia Transfer of Care Note  Patient: Steve Levy  Procedure(s) Performed: Procedure(s): LAPAROSCOPIC CHOLECYSTECTOMY WITH INTRAOPERATIVE CHOLANGIOGRAM (N/A)  Patient Location: PACU  Anesthesia Type:General  Level of Consciousness: awake, alert  and oriented  Airway & Oxygen Therapy: Patient Spontanous Breathing and Patient connected to face mask oxygen  Post-op Assessment: Report given to PACU RN and Post -op Vital signs reviewed and stable  Post vital signs: Reviewed and stable  Complications: No apparent anesthesia complications

## 2012-12-25 NOTE — H&P (View-Only) (Signed)
Chief complaint: Followup gallstone pancreatitis and pancreatic pseudocyst  History: Patient returns to the office after a hospitalization approximately 2 months ago for severe acute gallstone pancreatitis. The patient was hospitalized for about 2 weeks and on CT scan just prior to discharge he still had a large organizing pancreatic pseudocyst. Since discharge she clinically has done quite well. He does notice some aching pain after eating it radiates up into his left chest. This was not severe. He is able to be without nausea or vomiting. No fever or chills. No anterior abdominal pain.  Past Medical History  Diagnosis Date  . Chronic schizoaffective disorder   . Renal cyst   . Pancreatic pseudocyst 2014   No past surgical history on file. Current Outpatient Prescriptions  Medication Sig Dispense Refill  . divalproex (DEPAKOTE ER) 250 MG 24 hr tablet       . HYDROcodone-acetaminophen (NORCO/VICODIN) 5-325 MG per tablet       . lisinopril (PRINIVIL,ZESTRIL) 10 MG tablet Take 10 mg by mouth daily.      . ondansetron (ZOFRAN) 4 MG tablet Take 1 tablet (4 mg total) by mouth every 6 (six) hours as needed for nausea.  20 tablet  0  . oxyCODONE-acetaminophen (PERCOCET/ROXICET) 5-325 MG per tablet Take 1-2 tablets by mouth every 6 (six) hours as needed.  30 tablet  0  . QUEtiapine (SEROQUEL) 400 MG tablet Take 400 mg by mouth at bedtime.      . QUEtiapine (SEROQUEL) 400 MG tablet Take 400 mg by mouth at bedtime as needed. In addition to scheduled 400 mg po qHS dose.       No current facility-administered medications for this visit.   No Known Allergies History  Substance Use Topics  . Smoking status: Never Smoker   . Smokeless tobacco: Not on file  . Alcohol Use: 0.6 oz/week    1 Cans of beer per week   Exam: BP 122/74  Pulse 70  Temp(Src) 99.9 F (37.7 C) (Temporal)  Resp 16  Ht 5' 7" (1.702 m)  Wt 167 lb (75.751 kg)  BMI 26.15 kg/m2 General: Well-appearing Caucasian male Skin: No  rash or infection Lungs: Clear equal breath sounds without increased work of breathing Cardiac: Regular rate and rhythm. No edema Abdomen: Soft and nontender. I cannot feel any fullness or mass in the upper abdomen as I could prior to his discharge. No organomegaly. No hernias. Extremities: No edema or joint swelling Neurologic: He is alert and fully oriented. Affect normal.  Assessment and plan: Status post episode of severe acute gallstone pancreatitis with evolving pseudocysts on CT scan 6 weeks ago. He clinically is much improved. I would repeat his CT scan. If there has been significant improvement in the pseudocyst I believe we can proceed with laparoscopic cholecystectomy with cholangiogram to prevent further episodes of pancreatitis. This was all discussed with the patient. I will call him with the results of the CT scan. We discussed laparoscopic cholecystectomy including indications and risks.I discussed the procedure in detail.  The patient was given educational material.  We discussed the risks and benefits of a laparoscopic cholecystectomy and possible cholangiogram including, but not limited to bleeding, infection, injury to surrounding structures such as the intestine or liver, bile leak, retained gallstones, need to convert to an open procedure, prolonged diarrhea, blood clots such as  DVT, common bile duct injury, anesthesia risks, and possible need for additional procedures.  The likelihood of improvement in symptoms and return to the patient's normal status is   good. We discussed the typical post-operative recovery course. I will call him with the results of the CT scan and hopefully we can get his cholecystectomy scheduled. 

## 2012-12-25 NOTE — Op Note (Signed)
Preoperative diagnosis: Cholelithiasis and cholecystitis, Hx of gallstone pancreatitis  Postoperative diagnosis: Cholelithiasis and cholecystitis, Hx of gallstone pancreatitis  Surgical procedure: Laparoscopic cholecystectomy with intraoperative cholangiogram  Surgeon: Sharlet Salina Levy. Cassandra Mcmanaman M.D.  Assistant: Luretha Murphy  Anesthesia: General Endotracheal  Complications: None  Estimated blood loss: Minimal  Description of procedure: The patient brought to the operating room, placed in the supine position on the operating table, and general endotracheal anesthesia induced. The abdomen was widely sterilely prepped and draped. The patient had received preoperative IV antibiotics and PAS were in place. Patient timeout was performed the correct procedure verified. Standard 4 port technique was used with an open Hassan cannula at the umbilicus and the remainder of the ports placed under direct vision. The gallbladder was visualized. It appeared thickened, contracted and chronically inflammed. The fundus was grasped and elevated up over the liver and the infundibulum retracted inferiolaterally. Peritoneum anterior and posterior to close triangle was incised and fibrofatty tissue stripped off the neck of the gallbladder toward the porta hepatis. The distal gallbladder was thoroughly dissected. The cystic artery was identified in close triangle and the cystic duct gallbladder junction dissected 360.  A good critical view was obtained. When the anatomy was clear the cystic duct was clipped at the gallbladder junction and an operative cholangiogram obtained through the cystic duct. This showed good filling of a normal common bile duct and intrahepatic ducts with free flow into the duodenum and no filling defects. Following this the Cholangiocath was removed and the cystic duct was doubly clipped proximally and divided. The cystic artery was doubly clipped proximally and distally and divided. The gallbladder  was dissected free from its bed using hook cautery and removed through the umbilical port site. Complete hemostasis was obtained in the gallbladder bed. The right upper quadrant was thoroughly irrigated and hemostasis assured. Trochars were removed and all CO2 evacuated and the [facility] trocar site fascial defect closed. Skin incisions were closed with subcuticular Monocryl and Dermabond. Sponge needle and instrument counts were correct. The patient was taken to PACU in good condition.  Steve Levy  12/25/2012

## 2012-12-25 NOTE — Anesthesia Procedure Notes (Signed)
Procedure Name: Intubation Date/Time: 12/25/2012 4:48 PM Performed by: Leroy Libman L Patient Re-evaluated:Patient Re-evaluated prior to inductionOxygen Delivery Method: Circle system utilized Preoxygenation: Pre-oxygenation with 100% oxygen Intubation Type: IV induction Ventilation: Mask ventilation without difficulty and Oral airway inserted - appropriate to patient size Laryngoscope Size: Hyacinth Meeker and 2 Grade View: Grade I Tube type: Oral Tube size: 8.0 mm Number of attempts: 1 Airway Equipment and Method: Stylet Placement Confirmation: ETT inserted through vocal cords under direct vision and breath sounds checked- equal and bilateral Secured at: 22 cm Tube secured with: Tape Dental Injury: Teeth and Oropharynx as per pre-operative assessment

## 2012-12-25 NOTE — Anesthesia Preprocedure Evaluation (Addendum)
Anesthesia Evaluation  Patient identified by MRN, date of birth, ID band Patient awake    Reviewed: Allergy & Precautions, H&P , NPO status , Patient's Chart, lab work & pertinent test results  Airway Mallampati: II TM Distance: >3 FB Neck ROM: Full    Dental no notable dental hx.    Pulmonary neg pulmonary ROS,  breath sounds clear to auscultation  Pulmonary exam normal       Cardiovascular hypertension, Pt. on medications negative cardio ROS  Rhythm:Regular Rate:Normal     Neuro/Psych PSYCHIATRIC DISORDERS ( Chronic schizoaffective disorder) negative neurological ROS  negative psych ROS   GI/Hepatic negative GI ROS, Neg liver ROS,   Endo/Other  negative endocrine ROS  Renal/GU negative Renal ROS  negative genitourinary   Musculoskeletal negative musculoskeletal ROS (+)   Abdominal   Peds negative pediatric ROS (+)  Hematology negative hematology ROS (+)   Anesthesia Other Findings   Reproductive/Obstetrics negative OB ROS                          Anesthesia Physical Anesthesia Plan  ASA: II  Anesthesia Plan: General   Post-op Pain Management:    Induction: Intravenous  Airway Management Planned: Oral ETT  Additional Equipment:   Intra-op Plan:   Post-operative Plan: Extubation in OR  Informed Consent: I have reviewed the patients History and Physical, chart, labs and discussed the procedure including the risks, benefits and alternatives for the proposed anesthesia with the patient or authorized representative who has indicated his/her understanding and acceptance.   Dental advisory given  Plan Discussed with: CRNA  Anesthesia Plan Comments:         Anesthesia Quick Evaluation

## 2012-12-26 ENCOUNTER — Encounter (HOSPITAL_COMMUNITY): Payer: Self-pay | Admitting: General Surgery

## 2013-01-09 ENCOUNTER — Encounter (INDEPENDENT_AMBULATORY_CARE_PROVIDER_SITE_OTHER): Payer: Medicare Other | Admitting: General Surgery

## 2013-01-10 ENCOUNTER — Ambulatory Visit (INDEPENDENT_AMBULATORY_CARE_PROVIDER_SITE_OTHER): Payer: Medicare Other | Admitting: General Surgery

## 2013-01-10 VITALS — BP 114/74 | HR 88 | Temp 98.2°F | Resp 18 | Ht 67.0 in | Wt 167.0 lb

## 2013-01-10 DIAGNOSIS — K851 Biliary acute pancreatitis without necrosis or infection: Secondary | ICD-10-CM

## 2013-01-10 DIAGNOSIS — K859 Acute pancreatitis without necrosis or infection, unspecified: Secondary | ICD-10-CM

## 2013-01-10 NOTE — Patient Instructions (Addendum)
Please call as needed for any abdominal pain or other concerns

## 2013-01-10 NOTE — Progress Notes (Signed)
History: Patient returns following laparoscopic cholecystectomy following an episode of severe gallstone pancreatitis. He reports no problems and surgery. He is feeling well with no residual palpable pain or GI complaints.  Exam: BP 114/74  Pulse 88  Temp(Src) 98.2 F (36.8 C)  Resp 18  Ht 5\' 7"  (1.702 m)  Wt 167 lb (75.751 kg)  BMI 26.15 kg/m2 General: Appears well Abdomen: Soft and nontender. No masses or fullness. Incisions well healed.  Assessment and plan: Doing well following laparoscopic cholecystectomy. His preoperative CT showed marked improvement in pancreatic pseudocysts and I told him as long as he feels well I did not feel a need to repeat his CT scan. I asked him to call me if he has any of abdominal discomfort or other concerns and we could repeat a scan as needed.

## 2014-12-17 DIAGNOSIS — Z6827 Body mass index (BMI) 27.0-27.9, adult: Secondary | ICD-10-CM | POA: Diagnosis not present

## 2014-12-17 DIAGNOSIS — S30861A Insect bite (nonvenomous) of abdominal wall, initial encounter: Secondary | ICD-10-CM | POA: Diagnosis not present

## 2014-12-17 DIAGNOSIS — R21 Rash and other nonspecific skin eruption: Secondary | ICD-10-CM | POA: Diagnosis not present

## 2015-01-13 DIAGNOSIS — R972 Elevated prostate specific antigen [PSA]: Secondary | ICD-10-CM | POA: Diagnosis not present

## 2015-02-04 DIAGNOSIS — Z79899 Other long term (current) drug therapy: Secondary | ICD-10-CM | POA: Diagnosis not present

## 2015-02-04 DIAGNOSIS — Z0000 Encounter for general adult medical examination without abnormal findings: Secondary | ICD-10-CM | POA: Diagnosis not present

## 2015-02-04 DIAGNOSIS — I10 Essential (primary) hypertension: Secondary | ICD-10-CM | POA: Diagnosis not present

## 2015-02-04 DIAGNOSIS — Z6827 Body mass index (BMI) 27.0-27.9, adult: Secondary | ICD-10-CM | POA: Diagnosis not present

## 2015-02-04 DIAGNOSIS — Z1389 Encounter for screening for other disorder: Secondary | ICD-10-CM | POA: Diagnosis not present

## 2015-05-15 DIAGNOSIS — F250 Schizoaffective disorder, bipolar type: Secondary | ICD-10-CM | POA: Diagnosis not present

## 2015-07-07 ENCOUNTER — Encounter (HOSPITAL_COMMUNITY): Payer: Self-pay | Admitting: Emergency Medicine

## 2015-07-07 ENCOUNTER — Ambulatory Visit (HOSPITAL_COMMUNITY)
Admission: RE | Admit: 2015-07-07 | Discharge: 2015-07-07 | Disposition: A | Discharge status: Home / Self Care | Payer: Commercial Managed Care - HMO | Source: Home / Self Care | Attending: Psychiatry | Admitting: Psychiatry

## 2015-07-07 ENCOUNTER — Emergency Department (HOSPITAL_COMMUNITY): Payer: Commercial Managed Care - HMO

## 2015-07-07 ENCOUNTER — Emergency Department (HOSPITAL_COMMUNITY)
Admission: EM | Admit: 2015-07-07 | Discharge: 2015-07-10 | Disposition: A | Discharge status: Other Acute Inpatient Hospital | Payer: Commercial Managed Care - HMO | Attending: Emergency Medicine | Admitting: Emergency Medicine

## 2015-07-07 DIAGNOSIS — Z8719 Personal history of other diseases of the digestive system: Secondary | ICD-10-CM | POA: Insufficient documentation

## 2015-07-07 DIAGNOSIS — L03115 Cellulitis of right lower limb: Secondary | ICD-10-CM | POA: Insufficient documentation

## 2015-07-07 DIAGNOSIS — Z23 Encounter for immunization: Secondary | ICD-10-CM | POA: Diagnosis not present

## 2015-07-07 DIAGNOSIS — Z008 Encounter for other general examination: Secondary | ICD-10-CM | POA: Diagnosis present

## 2015-07-07 DIAGNOSIS — L03116 Cellulitis of left lower limb: Secondary | ICD-10-CM | POA: Diagnosis not present

## 2015-07-07 DIAGNOSIS — F259 Schizoaffective disorder, unspecified: Secondary | ICD-10-CM | POA: Insufficient documentation

## 2015-07-07 DIAGNOSIS — I10 Essential (primary) hypertension: Secondary | ICD-10-CM | POA: Insufficient documentation

## 2015-07-07 DIAGNOSIS — M7732 Calcaneal spur, left foot: Secondary | ICD-10-CM | POA: Diagnosis not present

## 2015-07-07 DIAGNOSIS — F419 Anxiety disorder, unspecified: Secondary | ICD-10-CM | POA: Insufficient documentation

## 2015-07-07 DIAGNOSIS — N3289 Other specified disorders of bladder: Secondary | ICD-10-CM | POA: Diagnosis not present

## 2015-07-07 DIAGNOSIS — Z79899 Other long term (current) drug therapy: Secondary | ICD-10-CM | POA: Insufficient documentation

## 2015-07-07 DIAGNOSIS — M7731 Calcaneal spur, right foot: Secondary | ICD-10-CM | POA: Diagnosis not present

## 2015-07-07 DIAGNOSIS — Q6100 Congenital renal cyst, unspecified: Secondary | ICD-10-CM | POA: Insufficient documentation

## 2015-07-07 DIAGNOSIS — F258 Other schizoaffective disorders: Secondary | ICD-10-CM | POA: Diagnosis not present

## 2015-07-07 DIAGNOSIS — F17210 Nicotine dependence, cigarettes, uncomplicated: Secondary | ICD-10-CM | POA: Insufficient documentation

## 2015-07-07 LAB — COMPREHENSIVE METABOLIC PANEL
ALBUMIN: 4.3 g/dL (ref 3.5–5.0)
ALK PHOS: 62 U/L (ref 38–126)
ALT: 26 U/L (ref 17–63)
ANION GAP: 9 (ref 5–15)
AST: 39 U/L (ref 15–41)
BUN: 19 mg/dL (ref 6–20)
CALCIUM: 8.9 mg/dL (ref 8.9–10.3)
CO2: 26 mmol/L (ref 22–32)
Chloride: 99 mmol/L — ABNORMAL LOW (ref 101–111)
Creatinine, Ser: 1.32 mg/dL — ABNORMAL HIGH (ref 0.61–1.24)
GFR calc Af Amer: >60 mL/min (ref >60)
GFR calc non Af Amer: 59 mL/min — ABNORMAL LOW (ref >60)
GLUCOSE: 96 mg/dL (ref 65–99)
Potassium: 3.9 mmol/L (ref 3.5–5.1)
SODIUM: 134 mmol/L — AB (ref 135–145)
Total Bilirubin: 1 mg/dL (ref 0.3–1.2)
Total Protein: 7.6 g/dL (ref 6.5–8.1)

## 2015-07-07 LAB — RAPID URINE DRUG SCREEN, HOSP PERFORMED
Amphetamines: NOT DETECTED
BARBITURATES: NOT DETECTED
Benzodiazepines: NOT DETECTED
COCAINE: NOT DETECTED
Opiates: NOT DETECTED
TETRAHYDROCANNABINOL: NOT DETECTED

## 2015-07-07 LAB — URINALYSIS, ROUTINE W REFLEX MICROSCOPIC
Glucose, UA: NEGATIVE mg/dL
Hgb urine dipstick: NEGATIVE
Ketones, ur: 40 mg/dL — AB
LEUKOCYTES UA: NEGATIVE
NITRITE: NEGATIVE
PH: 6 (ref 5.0–8.0)
Protein, ur: NEGATIVE mg/dL
SPECIFIC GRAVITY, URINE: 1.03 (ref 1.005–1.030)
UROBILINOGEN UA: 1 mg/dL (ref 0.0–1.0)

## 2015-07-07 LAB — CBC WITH DIFFERENTIAL/PLATELET
BASOS ABS: 0 10*3/uL (ref 0.0–0.1)
BASOS PCT: 0 %
Eosinophils Absolute: 0.1 10*3/uL (ref 0.0–0.7)
Eosinophils Relative: 1 %
HEMATOCRIT: 45.5 % (ref 39.0–52.0)
HEMOGLOBIN: 15.4 g/dL (ref 13.0–17.0)
LYMPHS PCT: 16 %
Lymphs Abs: 1.4 10*3/uL (ref 0.7–4.0)
MCH: 30.7 pg (ref 26.0–34.0)
MCHC: 33.8 g/dL (ref 30.0–36.0)
MCV: 90.6 fL (ref 78.0–100.0)
MONO ABS: 1 10*3/uL (ref 0.1–1.0)
Monocytes Relative: 12 %
NEUTROS ABS: 6.3 10*3/uL (ref 1.7–7.7)
NEUTROS PCT: 71 %
Platelets: 210 10*3/uL (ref 150–400)
RBC: 5.02 MIL/uL (ref 4.22–5.81)
RDW: 12.7 % (ref 11.5–15.5)
WBC: 8.8 10*3/uL (ref 4.0–10.5)

## 2015-07-07 LAB — SALICYLATE LEVEL: Salicylate Lvl: <4 mg/dL (ref 2.8–30.0)

## 2015-07-07 LAB — ACETAMINOPHEN LEVEL

## 2015-07-07 LAB — ETHANOL: Alcohol, Ethyl (B): <5 mg/dL (ref <5)

## 2015-07-07 MED ORDER — QUETIAPINE FUMARATE 300 MG PO TABS
800.0000 mg | ORAL_TABLET | Freq: Every day | ORAL | Status: DC
  Administered 2015-07-07 – 2015-07-08 (×2): 800 mg via ORAL
  Filled 2015-07-07 (×3): qty 2

## 2015-07-07 MED ORDER — TETANUS-DIPHTH-ACELL PERTUSSIS 5-2.5-18.5 LF-MCG/0.5 IM SUSP
0.5000 mL | Freq: Once | INTRAMUSCULAR | Status: AC
  Administered 2015-07-07: 0.5 mL via INTRAMUSCULAR
  Filled 2015-07-07: qty 0.5

## 2015-07-07 MED ORDER — SODIUM CHLORIDE 0.9 % IV BOLUS (SEPSIS)
1000.0000 mL | Freq: Once | INTRAVENOUS | Status: AC
  Administered 2015-07-07: 1000 mL via INTRAVENOUS

## 2015-07-07 MED ORDER — CLINDAMYCIN PHOSPHATE 600 MG/50ML IV SOLN
600.0000 mg | Freq: Once | INTRAVENOUS | Status: AC
  Administered 2015-07-07: 600 mg via INTRAVENOUS
  Filled 2015-07-07: qty 50

## 2015-07-07 MED ORDER — CLINDAMYCIN HCL 300 MG PO CAPS
300.0000 mg | ORAL_CAPSULE | Freq: Three times a day (TID) | ORAL | Status: DC
  Administered 2015-07-08 – 2015-07-09 (×5): 300 mg via ORAL
  Filled 2015-07-07 (×7): qty 1

## 2015-07-07 MED ORDER — ACETAMINOPHEN 500 MG PO TABS: 500.0000 mg | ORAL_TABLET | Freq: Four times a day (QID) | ORAL | Status: DC | PRN

## 2015-07-07 MED ORDER — LISINOPRIL 10 MG PO TABS
10.0000 mg | ORAL_TABLET | Freq: Every day | ORAL | Status: DC
  Administered 2015-07-07 – 2015-07-09 (×3): 10 mg via ORAL
  Filled 2015-07-07 (×4): qty 1

## 2015-07-07 NOTE — ED Notes (Signed)
Pt presents with bizarre behavior, confusion, walking [place] railroad tracks.  Dressing to feet noted.  Denies SI/HI or AVH.  Denies feeling hopeless.  Pt poor historian.  Monitoring for safety, Q 15 min checks in effect.

## 2015-07-07 NOTE — ED Notes (Signed)
Dr. Darl Householder made aware of wound to lt foot s/p to walking barefooted [place] the night [place] railroad tracks. Cleanse foot with saline and applied xerofoam dsg. Pt tolerated well. Pt taken to x-ray.

## 2015-07-07 NOTE — ED Notes (Signed)
Steve Levy- [phone]

## 2015-07-07 NOTE — ED Notes (Signed)
Bed: [facility] Expected date:  Expected time:  Means of arrival:  Comments: 62

## 2015-07-07 NOTE — ED Provider Notes (Signed)
CSN: [id number]     Arrival date & time 07/07/15  1638 History   First MD Initiated Contact with Patient 07/07/15 1715     Chief Complaint  Patient presents with  . Medical Clearance     (Consider location/radiation/quality/duration/timing/severity/associated sxs/prior Treatment) The history is provided by the patient.  Steve Levy is a 54 y.o. male hx of schizoaffective, pseudocyst here with psychosis. As per his wife, he has been having unusual thoughts and more disoriented. Yesterday, he wondered outside barefoot and walked on the train tracks. Patient has not been eating and drinking well. Denies any abdominal pain or fevers or chills. He went to behavior health and was supposed to be admitted but there is no bed there so sent here for medical clearance. Denies hallucinations or suicidal or homicidal ideations    Past Medical History  Diagnosis Date  . Chronic schizoaffective disorder ([place])   . Renal cyst   . Pancreatic pseudocyst 2014  . Hypertension    Past Surgical History  Procedure Laterality Date  . Colonoscopy    . Lasik  2000  . Cholecystectomy N/A 12/25/2012    Procedure: LAPAROSCOPIC CHOLECYSTECTOMY WITH INTRAOPERATIVE CHOLANGIOGRAM;  Surgeon: Steve Jolly, MD;  Location: [facility];  Service: General;  Laterality: N/A;   No family history on file. Social History  Substance Use Topics  . Smoking status: Current Every Day Smoker    Types: Cigarettes  . Smokeless tobacco: Former Systems developer    Types: Chew    Quit date: 08/23/1998  . Alcohol Use: 0.6 oz/week    1 Cans of beer per week     Comment: occasional    Review of Systems  Skin: Positive for wound.  Psychiatric/Behavioral: The patient is nervous/anxious.   All other systems reviewed and are negative.     Allergies  Review of patient's allergies indicates no known allergies.  Home Medications   Prior to Admission medications   Medication Sig Start Date End Date Taking? Authorizing Provider   lisinopril (PRINIVIL,ZESTRIL) 10 MG tablet Take 10 mg by mouth at bedtime.    Yes Historical Provider, MD  QUEtiapine (SEROQUEL) 400 MG tablet Take 800 mg by mouth at bedtime.    Yes Historical Provider, MD  acetaminophen (TYLENOL) 500 MG tablet Take 500 mg by mouth every 6 (six) hours as needed for pain.    Historical Provider, MD  oxyCODONE-acetaminophen (ROXICET) 5-325 MG per tablet Take 1-2 tablets by mouth every 4 (four) hours as needed for pain. Patient not taking: Reported on 07/07/2015 12/25/12   Excell Seltzer, MD   BP 147/91 mmHg  Pulse 106  Temp(Src) 98.2 F (36.8 C) (Oral)  Resp 20  SpO2 98% Physical Exam  Constitutional: He is oriented to person, place, and time.  Anxious   HENT:  Head: Normocephalic.  Mouth/Throat: Oropharynx is clear and moist.  Eyes: Conjunctivae are normal. Pupils are equal, round, and reactive to light.  Neck: Normal range of motion. Neck supple.  Cardiovascular: Normal rate, regular rhythm and normal heart sounds.   Pulmonary/Chest: Effort normal and breath sounds normal. No respiratory distress. He has no wheezes. He has no rales.  Abdominal: Soft. Bowel sounds are normal. He exhibits no distension. There is no tenderness. There is no rebound.  Musculoskeletal: Normal range of motion.  Neurological: He is alert and oriented to person, place, and time.  Skin:  Wound plantar aspect l foot with erythema, mild erythema R plantar aspect R foot   Nursing note and vitals reviewed.  ED Course  Procedures (including critical care time) Labs Review Labs Reviewed  COMPREHENSIVE METABOLIC PANEL - Abnormal; Notable for the following:    Sodium 134 (*)    Chloride 99 (*)    Creatinine, Ser 1.32 (*)    GFR calc non Af Amer 59 (*)    All other components within normal limits  ACETAMINOPHEN LEVEL - Abnormal; Notable for the following:    Acetaminophen (Tylenol), Serum <10 (*)    All other components within normal limits  URINALYSIS, ROUTINE W REFLEX  MICROSCOPIC (NOT AT [facility]) - Abnormal; Notable for the following:    Color, Urine AMBER (*)    Bilirubin Urine SMALL (*)    Ketones, ur 40 (*)    All other components within normal limits  ETHANOL  SALICYLATE LEVEL  URINE RAPID DRUG SCREEN, HOSP PERFORMED  CBC WITH DIFFERENTIAL/PLATELET    Imaging Review Dg Foot Complete Left  07/07/2015  CLINICAL DATA:  Redness and pain to right and left feet. Per ED notes wound to lt foot s/p to walking barefooted [place] tracks. Cellulitis to bilateral feet. EXAM: LEFT FOOT - COMPLETE 3+ VIEW COMPARISON:  None. FINDINGS: There is no evidence of fracture or dislocation. Small calcaneal spurs. There is no evidence of arthropathy or other focal bone abnormality. Soft tissues are unremarkable. IMPRESSION: 1. No fracture or other acute abnormality. 2. Small calcaneal spurs. Electronically Signed   By: Lucrezia Europe M.D.   On: 07/07/2015 18:35   Dg Foot Complete Right  07/07/2015  CLINICAL DATA:  Redness and pain to right and left feet. Per ED notes wound to lt foot s/p to walking barefooted [place] railroad tracks. Cellulitis to bilateral feet. EXAM: RIGHT FOOT COMPLETE - 3+ VIEW COMPARISON:  None. FINDINGS: There is no evidence of fracture or dislocation. Small calcaneal spurs. There is no evidence of arthropathy or other focal bone abnormality. Soft tissues are unremarkable. IMPRESSION: 1. No acute abnormality or fracture.  Small calcaneal spurs. Electronically Signed   By: Lucrezia Europe M.D.   On: 07/07/2015 18:47   I have personally reviewed and evaluated these images and lab results as part of my medical decision-making.   EKG Interpretation None      MDM   Final diagnoses:  None    Steve Levy is a 54 y.o. male here with leg cellulitis, psychosis. Patient has mild cellulitis from walking on train tracks. Xrays showed no air. WBC nl. Given clinda. Can finish 7 days of clinda and get tdap. Will not need admission for  cellulitis. Slightly tachy likely from cellulitis vs dehydration. Given 2 L NS and heart rate improved. Medically cleared. Will have TTS find placement (already saw psych at [facility])    Steve Arthurs, MD 07/07/15 2129

## 2015-07-07 NOTE — ED Notes (Signed)
Pt cannot use restroom at this time, aware specimen is needed. Urinal provided. 

## 2015-07-07 NOTE — ED Notes (Signed)
Awake. Verbally responsive. A/O x4. Resp even and unlabored. No audible adventitious breath sounds noted. ABC's intact. IV infusing NS at 982ml/hr and ABT at 160ml/hr without difficulty.

## 2015-07-07 NOTE — ED Notes (Signed)
Pt sent from [facility] by [facility] for medical clearance. Pt having "schizo exasherbation" per family.  Pt takes his medications as ordered per pt. Pt denies SI or HI.

## 2015-07-07 NOTE — ED Notes (Signed)
MD at bedside. 

## 2015-07-07 NOTE — ED Notes (Signed)
Awake. Verbally responsive. A/O x4. Resp even and unlabored. No audible adventitious breath sounds noted. ABC's intact. Pt offered meal tray but refused. Pt stated that he was not hungry at this time. IV infusing NS at 959ml/hr and ABT at 160ml/hr without difficulty.

## 2015-07-07 NOTE — ED Notes (Signed)
Pt reported having weird thoughts and disorientation. Family reported that pt having "schizo exacerbation". Noted outside wandering barefooted.  Pt denies SI/HI and audible/visual hallucinations.

## 2015-07-07 NOTE — ED Notes (Signed)
Pt's wife, Benjamine Mola, took pt belongings home

## 2015-07-07 NOTE — BH Assessment (Addendum)
Assessment Note  EDUARDO MENGE (goes by his middle name, Lanny Hurst) is a 54 y.o. male who presents voluntarily to [facility] as a walk in for "acute exacerbation of mental health". Pt was accompanied by his wife, Benjamine Mola, during the assessment. Pt with depressed, anxious, ashamed, and frightened affect and mood. Pt indicated that he has just gotten "realy confused" and he "can't get any sleep". He also reported that he was "feeling lost and not sure what to do". Pt was clearly having difficulty formulating his thoughts and his wife assisted him in the assessment. Per Benjamine Mola, pt has not really slept in a week, when his symptoms began. Pt was not able to identify any trigger that caused his sudden state of confusion. Pt also shared that he has been having "bizarre thoughts" but was unable to elaborate as the thoughts were "too jumbled".  Pt denied SI/AVH, stating slowly, "no, not really" to both questions. He denied HI more clearly. Pt sees Dr. Reece Levy for psychiatry services and has an appt schedule for tomorrow, but they did not feel that he could wait until tomorrow. Benjamine Mola stated that what was most concerning for her was when she came from work this morning (she works nights), she noticed fresh blood on the floor and, upon inquiry, discovered that pt had went out at some point in the middle of the night and was walking [place] railroad tracks [place] apparently without shoes. Counselor asked pt if he could elaborate on what happened. He reported that he remembers walking on the railroad tracks and, at some point, taking his shoes off. He was not able to remember if he retrieved his shoes, as his wife stated she was not able to find them. Pt was also not able to recall why he was walking on the railroad tracks or when he came home.   Diagnosis: 295.70 Schizoaffective disorder, Bipolar type  Past Medical History:  Past Medical History  Diagnosis Date  . Chronic schizoaffective disorder   . Renal  cyst   . Pancreatic pseudocyst 2014  . Hypertension     Past Surgical History  Procedure Laterality Date  . Colonoscopy    . Lasik  2000  . Cholecystectomy N/A 12/25/2012    Procedure: LAPAROSCOPIC CHOLECYSTECTOMY WITH INTRAOPERATIVE CHOLANGIOGRAM;  Surgeon: Edward Jolly, MD;  Location: [facility];  Service: General;  Laterality: N/A;    Family History: No family history on file.  Social History:  reports that he has never smoked. He quit smokeless tobacco use about 16 years ago. His smokeless tobacco use included Chew. He reports that he drinks about 0.6 oz of alcohol per week. He reports that he does not use illicit drugs.  Additional Social History:  Alcohol / Drug Use Pain Medications: none Prescriptions: Seroquel 800 mg at bedtime Over the Counter: none History of alcohol / drug use?: No history of alcohol / drug abuse  CIWA:   COWS:    Allergies: No Known Allergies  Home Medications:  (Not in a hospital admission)  OB/GYN Status:  No LMP for male patient.  General Assessment Data Location of Assessment: [facility] Assessment Services TTS Assessment: In system Is this a Tele or Face-to-Face Assessment?: Face-to-Face Is this an Initial Assessment or a Re-assessment for this encounter?: Initial Assessment Marital status: Married Is patient pregnant?: No Pregnancy Status: No Living Arrangements: Spouse/significant other Can pt return to current living arrangement?: Yes Admission Status: Voluntary Is patient capable of signing voluntary admission?: Yes Referral Source: Self/Family/Friend Google  type: [employer]  Medical Screening Exam ([place]) Medical Exam completed: No Reason for MSE not completed:  (Pt being sent to [facility] for med clearance)  Crisis Care Plan Living Arrangements: Spouse/significant other Name of Psychiatrist: Dr. Reece Levy Name of Therapist: none  Education Status Is patient currently in school?: No  Risk to self with the past 6  months Suicidal Ideation: No Has patient been a risk to self within the past 6 months prior to admission? : Yes Suicidal Intent: No Has patient had any suicidal intent within the past 6 months prior to admission? : No Is patient at risk for suicide?: No Suicidal Plan?: No Has patient had any suicidal plan within the past 6 months prior to admission? : No Access to Means: No What has been your use of drugs/alcohol within the last 12 months?: pt denies Previous Attempts/Gestures: Yes How many times?: 1 Triggers for Past Attempts: Unknown Intentional Self Injurious Behavior: None Family Suicide History: Unknown Persecutory voices/beliefs?: No Depression: No Depression Symptoms: Insomnia Substance abuse history and/or treatment for substance abuse?: No Suicide prevention information given to non-admitted patients: Not applicable  Risk to Others within the past 6 months Homicidal Ideation: No Does patient have any lifetime risk of violence toward others beyond the six months prior to admission? : No Thoughts of Harm to Others: No Current Homicidal Intent: No Current Homicidal Plan: No Access to Homicidal Means: No History of harm to others?: No Assessment of Violence: None Noted Does patient have access to weapons?: No Criminal Charges Pending?: No Does patient have a court date: No Is patient on probation?: No  Psychosis Hallucinations: None noted Delusions: Unspecified (bizarre thoughts)  Mental Status Report Appearance/Hygiene: Unremarkable Eye Contact: Poor Motor Activity: Unremarkable Speech: Soft Level of Consciousness: Quiet/awake Mood: Depressed, Anxious, Ashamed/humiliated, Helpless Affect: Anxious, Appropriate to circumstance, Depressed, Frightened Anxiety Level: Moderate Thought Processes: Unable to Assess Judgement: Impaired Orientation: Unable to assess Obsessive Compulsive Thoughts/Behaviors: Unable to Assess  Cognitive Functioning Concentration:  Decreased Memory: Recent Impaired, Unable to Assess IQ: Average Insight: Fair Impulse Control: Unable to Assess Appetite: Poor Sleep: Decreased Total Hours of Sleep: 0 Vegetative Symptoms: None  ADLScreening [facility] Assessment Services) Patient's cognitive ability adequate to safely complete daily activities?: Yes Patient able to express need for assistance with ADLs?: Yes Independently performs ADLs?: Yes (appropriate for developmental age)  Prior Inpatient Therapy Prior Inpatient Therapy: Yes Prior Therapy Dates: 6 yrs ago Prior Therapy Facilty/Provider(s): [facility] Reason for Treatment: acute exacerbation of mental health  Prior Outpatient Therapy Prior Outpatient Therapy: Yes Prior Therapy Dates: current Prior Therapy Facilty/Provider(s): Dr. Reece Levy Reason for Treatment: schizoaffective Does patient have an ACCT team?: No Does patient have Intensive In-House Services?  : No Does patient have [facility] services? : No Does patient have P4CC services?: No  ADL Screening (condition at time of admission) Patient's cognitive ability adequate to safely complete daily activities?: Yes Is the patient deaf or have difficulty hearing?: No Does the patient have difficulty seeing, even when wearing glasses/contacts?: No Does the patient have difficulty concentrating, remembering, or making decisions?: Yes Patient able to express need for assistance with ADLs?: Yes Does the patient have difficulty dressing or bathing?: No Independently performs ADLs?: Yes (appropriate for developmental age) Does the patient have difficulty walking or climbing stairs?: No Weakness of Legs: None Weakness of Arms/Hands: None  Home Assistive Devices/Equipment Home Assistive Devices/Equipment: None  Therapy Consults (therapy consults require a physician order) PT Evaluation Needed: No OT Evalulation Needed: No SLP Evaluation  Needed: No Abuse/Neglect Assessment (Assessment to be complete while patient is  alone) Physical Abuse: Denies Verbal Abuse: Denies Sexual Abuse: Denies Exploitation of patient/patient's resources: Denies Self-Neglect: Denies Values / Beliefs Cultural Requests During Hospitalization: None Spiritual Requests During Hospitalization: None Consults Spiritual Care Consult Needed: No Social Work Consult Needed: No      Additional Information 1:1 In Past 12 Months?: No CIRT Risk: No Elopement Risk: No Does patient have medical clearance?: No     Disposition:  Disposition Initial Assessment Completed for this Encounter: Yes Disposition of Patient: Inpatient treatment program (per Elmarie Shiley, NP) Type of inpatient treatment program: Adult (TTS to seek placement)  On Site Evaluation by:   Reviewed with Physician:    Rexene Edison 07/07/2015 4:35 PM

## 2015-07-08 ENCOUNTER — Emergency Department (HOSPITAL_COMMUNITY): Payer: Commercial Managed Care - HMO

## 2015-07-08 DIAGNOSIS — L03115 Cellulitis of right lower limb: Secondary | ICD-10-CM | POA: Diagnosis not present

## 2015-07-08 DIAGNOSIS — Z23 Encounter for immunization: Secondary | ICD-10-CM | POA: Diagnosis not present

## 2015-07-08 DIAGNOSIS — F17210 Nicotine dependence, cigarettes, uncomplicated: Secondary | ICD-10-CM | POA: Diagnosis not present

## 2015-07-08 DIAGNOSIS — L03116 Cellulitis of left lower limb: Secondary | ICD-10-CM | POA: Diagnosis not present

## 2015-07-08 DIAGNOSIS — F259 Schizoaffective disorder, unspecified: Secondary | ICD-10-CM | POA: Diagnosis present

## 2015-07-08 DIAGNOSIS — N3289 Other specified disorders of bladder: Secondary | ICD-10-CM | POA: Diagnosis not present

## 2015-07-08 DIAGNOSIS — F419 Anxiety disorder, unspecified: Secondary | ICD-10-CM | POA: Diagnosis not present

## 2015-07-08 DIAGNOSIS — Z79899 Other long term (current) drug therapy: Secondary | ICD-10-CM | POA: Diagnosis not present

## 2015-07-08 DIAGNOSIS — F258 Other schizoaffective disorders: Secondary | ICD-10-CM | POA: Diagnosis not present

## 2015-07-08 DIAGNOSIS — I10 Essential (primary) hypertension: Secondary | ICD-10-CM | POA: Diagnosis not present

## 2015-07-08 DIAGNOSIS — Q6100 Congenital renal cyst, unspecified: Secondary | ICD-10-CM | POA: Diagnosis not present

## 2015-07-08 LAB — URINALYSIS, ROUTINE W REFLEX MICROSCOPIC
BILIRUBIN URINE: NEGATIVE
GLUCOSE, UA: NEGATIVE mg/dL
HGB URINE DIPSTICK: NEGATIVE
KETONES UR: 40 mg/dL — AB
Leukocytes, UA: NEGATIVE
Nitrite: NEGATIVE
PH: 6 (ref 5.0–8.0)
PROTEIN: NEGATIVE mg/dL
Specific Gravity, Urine: 1.021 (ref 1.005–1.030)

## 2015-07-08 NOTE — BHH Counselor (Signed)
07/08/15 Referral sent to [place], [place], [place], [place], [place], [place], [place], [place] Nash Shearer, L[facility]-A, [facility]  Counselor 07/08/2015 12:24 AM

## 2015-07-08 NOTE — ED Notes (Signed)
Sent message to pharmacy for patient's Seroquel.

## 2015-07-08 NOTE — ED Provider Notes (Signed)
Patient was complaining of abdominal pain earlier today Seen by Dr. Ashok Cordia. CT showed extension of bladder. US showed > 1L.  Patient unable to void. We will place Foley. Plan to keep Foley in for 1 week until follow-up with urology. Pt still medically cleared.   Anaiya Wisinski Julio Alm, MD 07/08/15 1751

## 2015-07-08 NOTE — ED Notes (Signed)
Bladder scan complete and >1095ml urine noted.  EDP notified.

## 2015-07-08 NOTE — ED Notes (Signed)
Dressing to left foot changed.

## 2015-07-08 NOTE — ED Notes (Signed)
Patient denies pain at this time.  Urinary catheter draining clear yellow urine.

## 2015-07-08 NOTE — ED Notes (Signed)
Pt complains of severe stomach pain.  He has been in and out of bathroom multiple times.  He denies nausea and vomiting.  His abdomen is firm to palpate.  He was seen laying on bathroom floor where he said he was ok but just in a lot of pain.  EDP notified.

## 2015-07-08 NOTE — ED Notes (Signed)
Patient's wife at bedside - wanded by security prior to coming to room.

## 2015-07-08 NOTE — ED Notes (Signed)
Pt noted walking down hall completely naked.  He was easily redirected to his room.  He did not put his scrubs back on and was noted kneeling naked beside his bed.

## 2015-07-08 NOTE — ED Notes (Signed)
Patient transferred to room 17.  Report given to charge nurse.

## 2015-07-08 NOTE — BH Assessment (Signed)
[place] Assessment Progress Note  The following facilities have been contacted to seek placement for this pt, with results as noted:  Beds available, information sent, decision pending:  [facility]   No beds available, but accepting referrals for future consideration; information sent:  [facility]al [facility]  At capacity:  Tildon Husky [place], [place] Triage Specialist [phone]

## 2015-07-08 NOTE — ED Notes (Signed)
Bed: WA17 Expected date:  Expected time:  Means of arrival:  Comments: BH36

## 2015-07-08 NOTE — ED Provider Notes (Signed)
Patient noted by [facility] staff to c/o lower abdominal pain throughout the day today.  Pt denies diarrhea or constipation - states had bm today.  No scrotal or testicular pain or swelling. No dysuria. No back or flank pain. Pt afeb. Pt is noted to be on clinda for ?infected foot wound - superficial ulcer to plantar aspect foot clean, mild drainage. Pt indicates improved from prior.  ?whether clinda related to gi upset - pt denies diarrhea currently, and pain/tenderness lower abd.  No exam, moderate lower abd tenderness, esp left. Will get ct.  Signed out to Dr Thomasene Lot  - check ct when back.     Lajean Saver, MD 07/08/15 [phone]

## 2015-07-08 NOTE — Consult Note (Signed)
[facility] Face-to-Face Psychiatry Consult   Reason for Consult:  Paranoia, Confusion, Forgetfulness. Referring Physician:  EDP Patient Identification: Steve Levy MRN:  [id number] Principal Diagnosis: Schizoaffective disorder, chronic condition with acute exacerbation [facility]) Diagnosis:   Patient Active Problem List   Diagnosis Date Noted  . Schizoaffective disorder, chronic condition with acute exacerbation ([place]) [F25.8] 07/08/2015    Priority: High  . Pancreatitis [K85.9] 10/23/2012  . Cholelithiasis [K80.20] 10/23/2012  . Hypotension [I95.9] 10/23/2012  . AKI (acute kidney injury) ([place]) [N17.9] 10/23/2012  . Abdominal pain [R10.9] 10/23/2012    Total Time spent with patient: 45 minutes  Subjective:   Steve Levy is a 54 y.o. male patient admitted with Paranoia, Confusion, Forgetfulness.  HPI:  Caucasian male, 59 years was evaluated for confusion, Paranoia and forgetfulness.   Patient was brought in by his wife after patient could not expliane things to his wife.  Patient reports that he has been having sleepless night, have not been able to to put words and sentences together.  He reports he has been having "Bizzare thoughts" but could not explain the thoughts he is having.  He is separated from his wife but his wife was the one who answered questions on his arrival yesterday.  Today patient participated in the interview but had difficulty answering some questions.  He sees DR Reece [facility] at [facility] who diagnosed him with Schizoaffective disorder.  Patient reports that he left his home early morning of yesterday [place]  Patient sustained injuries to his foot and had his foot bandaged up.  Patient denies SI/HI/AVH, He denies Alcohol or substance use.  Patient was hospitalized 6 years ago.   He has been accepted for admission and we will be seeking plcement at any facility with available bed.  Past Psychiatric History: Schizoaffective disorder.  Risk to  Self: Is patient at risk for suicide?: No, but patient needs Medical Clearance Risk to Others:   Prior Inpatient Therapy:   Prior Outpatient Therapy:    Past Medical History:  Past Medical History  Diagnosis Date  . Chronic schizoaffective disorder ([place])   . Renal cyst   . Pancreatic pseudocyst 2014  . Hypertension     Past Surgical History  Procedure Laterality Date  . Colonoscopy    . Lasik  2000  . Cholecystectomy N/A 12/25/2012    Procedure: LAPAROSCOPIC CHOLECYSTECTOMY WITH INTRAOPERATIVE CHOLANGIOGRAM;  Surgeon: Edward Jolly, MD;  Location: [facility];  Service: General;  Laterality: N/A;   Family History: No family history on file.  \ Family Psychiatric  History:  Unknown Social History:  History  Alcohol Use  . 0.6 oz/week  . 1 Cans of beer per week    Comment: occasional     History  Drug Use No    Social History   Social History  . Marital Status: Married    Spouse Name: N/A  . Number of Children: N/A  . Years of Education: N/A   Social History Main Topics  . Smoking status: Current Every Day Smoker    Types: Cigarettes  . Smokeless tobacco: Former Systems developer    Types: Chew    Quit date: 08/23/1998  . Alcohol Use: 0.6 oz/week    1 Cans of beer per week     Comment: occasional  . Drug Use: No  . Sexual Activity: Yes   Other Topics Concern  . None   Social History Narrative   Additional Social History:  Allergies:  No Known Allergies  Labs:  Results for orders placed or performed during the hospital encounter of 07/07/15 (from the past 48 hour(s))  Comprehensive metabolic panel     Status: Abnormal   Collection Time: 07/07/15  5:40 PM  Result Value Ref Range   Sodium 134 (L) 135 - 145 mmol/L   Potassium 3.9 3.5 - 5.1 mmol/L   Chloride 99 (L) 101 - 111 mmol/L   CO2 26 22 - 32 mmol/L   Glucose, Bld 96 65 - 99 mg/dL   BUN 19 6 - 20 mg/dL   Creatinine, Ser 1.32 (H) 0.61 - 1.24 mg/dL   Calcium 8.9 8.9 - 10.3 mg/dL   Total Protein 7.6 6.5 -  8.1 g/dL   Albumin 4.3 3.5 - 5.0 g/dL   AST 39 15 - 41 U/L   ALT 26 17 - 63 U/L   Alkaline Phosphatase 62 38 - 126 U/L   Total Bilirubin 1.0 0.3 - 1.2 mg/dL   GFR calc non Af Amer 59 (L) >60 mL/min   GFR calc Af Amer >60 >60 mL/min    Comment: (NOTE) The eGFR has been calculated using the CKD EPI equation. This calculation has not been validated in all clinical situations. eGFR's persistently <60 mL/min signify possible Chronic Kidney Disease.    Anion gap 9 5 - 15  Ethanol (ETOH)     Status: None   Collection Time: 07/07/15  5:40 PM  Result Value Ref Range   Alcohol, Ethyl (B) <5 <5 mg/dL    Comment:        LOWEST DETECTABLE LIMIT FOR SERUM ALCOHOL IS 5 mg/dL FOR MEDICAL PURPOSES ONLY   Salicylate level     Status: None   Collection Time: 07/07/15  5:40 PM  Result Value Ref Range   Salicylate Lvl <3.5 2.8 - 30.0 mg/dL  Acetaminophen level     Status: Abnormal   Collection Time: 07/07/15  5:40 PM  Result Value Ref Range   Acetaminophen (Tylenol), Serum <10 (L) 10 - 30 ug/mL    Comment:        THERAPEUTIC CONCENTRATIONS VARY SIGNIFICANTLY. A RANGE OF 10-30 ug/mL MAY BE AN EFFECTIVE CONCENTRATION FOR MANY PATIENTS. HOWEVER, SOME ARE BEST TREATED AT CONCENTRATIONS OUTSIDE THIS RANGE. ACETAMINOPHEN CONCENTRATIONS >150 ug/mL AT 4 HOURS AFTER INGESTION AND >50 ug/mL AT 12 HOURS AFTER INGESTION ARE OFTEN ASSOCIATED WITH TOXIC REACTIONS.   CBC with Differential     Status: None   Collection Time: 07/07/15  5:40 PM  Result Value Ref Range   WBC 8.8 4.0 - 10.5 K/uL   RBC 5.02 4.22 - 5.81 MIL/uL   Hemoglobin 15.4 13.0 - 17.0 g/dL   HCT 45.5 39.0 - 52.0 %   MCV 90.6 78.0 - 100.0 fL   MCH 30.7 26.0 - 34.0 pg   MCHC 33.8 30.0 - 36.0 g/dL   RDW 12.7 11.5 - 15.5 %   Platelets 210 150 - 400 K/uL   Neutrophils Relative % 71 %   Neutro Abs 6.3 1.7 - 7.7 K/uL   Lymphocytes Relative 16 %   Lymphs Abs 1.4 0.7 - 4.0 K/uL   Monocytes Relative 12 %   Monocytes Absolute 1.0 0.1  - 1.0 K/uL   Eosinophils Relative 1 %   Eosinophils Absolute 0.1 0.0 - 0.7 K/uL   Basophils Relative 0 %   Basophils Absolute 0.0 0.0 - 0.1 K/uL  Urine rapid drug screen (hosp performed) (Not at [facility])     Status:  None   Collection Time: 07/07/15  6:30 PM  Result Value Ref Range   Opiates NONE DETECTED NONE DETECTED   Cocaine NONE DETECTED NONE DETECTED   Benzodiazepines NONE DETECTED NONE DETECTED   Amphetamines NONE DETECTED NONE DETECTED   Tetrahydrocannabinol NONE DETECTED NONE DETECTED   Barbiturates NONE DETECTED NONE DETECTED    Comment:        DRUG SCREEN FOR MEDICAL PURPOSES ONLY.  IF CONFIRMATION IS NEEDED FOR ANY PURPOSE, NOTIFY LAB WITHIN 5 DAYS.        LOWEST DETECTABLE LIMITS FOR URINE DRUG SCREEN Drug Class       Cutoff (ng/mL) Amphetamine      1000 Barbiturate      200 Benzodiazepine   007 Tricyclics       121 Opiates          300 Cocaine          300 THC              50   Urinalysis, Routine w reflex microscopic (not at [facility])     Status: Abnormal   Collection Time: 07/07/15  6:30 PM  Result Value Ref Range   Color, Urine AMBER (A) YELLOW    Comment: BIOCHEMICALS MAY BE AFFECTED BY COLOR   APPearance CLEAR CLEAR   Specific Gravity, Urine 1.030 1.005 - 1.030   pH 6.0 5.0 - 8.0   Glucose, UA NEGATIVE NEGATIVE mg/dL   Hgb urine dipstick NEGATIVE NEGATIVE   Bilirubin Urine SMALL (A) NEGATIVE   Ketones, ur 40 (A) NEGATIVE mg/dL   Protein, ur NEGATIVE NEGATIVE mg/dL   Urobilinogen, UA 1.0 0.0 - 1.0 mg/dL   Nitrite NEGATIVE NEGATIVE   Leukocytes, UA NEGATIVE NEGATIVE    Comment: MICROSCOPIC NOT DONE ON URINES WITH NEGATIVE PROTEIN, BLOOD, LEUKOCYTES, NITRITE, OR GLUCOSE <1000 mg/dL.    Current Facility-Administered Medications  Medication Dose Route Frequency Provider Last Rate Last Dose  . acetaminophen (TYLENOL) tablet 500 mg  500 mg Oral Q6H PRN Wandra Arthurs, MD      . clindamycin (CLEOCIN) capsule 300 mg  300 mg Oral 3 times per day Wandra Arthurs, MD    300 mg at 07/08/15 [phone]  . lisinopril (PRINIVIL,ZESTRIL) tablet 10 mg  10 mg Oral QHS Wandra Arthurs, MD   10 mg at 07/07/15 2213  . QUEtiapine (SEROQUEL) tablet 800 mg  800 mg Oral QHS Wandra Arthurs, MD   800 mg at 07/07/15 2213   Current Outpatient Prescriptions  Medication Sig Dispense Refill  . lisinopril (PRINIVIL,ZESTRIL) 10 MG tablet Take 10 mg by mouth at bedtime.     Marland Kitchen QUEtiapine (SEROQUEL) 400 MG tablet Take 800 mg by mouth at bedtime.     Marland Kitchen acetaminophen (TYLENOL) 500 MG tablet Take 500 mg by mouth every 6 (six) hours as needed for pain.    Marland Kitchen oxyCODONE-acetaminophen (ROXICET) 5-325 MG per tablet Take 1-2 tablets by mouth every 4 (four) hours as needed for pain. (Patient not taking: Reported on 07/07/2015) 40 tablet 0    Musculoskeletal: Strength & Muscle Tone: within normal limits Gait & Station: normal Patient leans: N/A  Psychiatric Specialty Exam: Review of Systems  Constitutional: Negative.   HENT: Negative.   Eyes: Negative.   Respiratory: Negative.   Cardiovascular: Negative.   Gastrointestinal: Negative.   Genitourinary: Negative.   Musculoskeletal: Negative.   Skin: Negative.   Neurological: Negative.   Endo/Heme/Allergies: Negative.     Blood pressure 147/81, pulse 76, temperature 98.1  F (36.7 C), temperature source Oral, resp. rate 20, SpO2 100 %.There is no weight on file to calculate BMI.  General Appearance: Casual and Disheveled  Eye Contact::  Good  Speech:  Clear and Coherent and Normal Rate  Volume:  Normal  Mood:  Anxious and Depressed  Affect:  Congruent, Depressed and Flat  Thought Process:  Disorganized, Tangential and Does not remember much.  Unable to describe what led to this visit, why he [place]  Orientation:  Other:  place and name  Thought Content:  Paranoid Ideation and thoughts about unrealistic things.  Suicidal Thoughts:  No  Homicidal Thoughts:  No  Memory:  Immediate;    Poor Recent;   Poor Remote;   Poor  Judgement:  Impaired  Insight:  Shallow  Psychomotor Activity:  Normal  Concentration:  Fair  Recall:  NA  Fund of Knowledge:Fair  Language: Good  Akathisia:  NA  Handed:  Right  AIMS (if indicated):     Assets:  Desire for Improvement  ADL's:  Impaired  Cognition: WNL  Sleep:      Treatment Plan Summary: Daily contact with patient to assess and evaluate symptoms and progress in treatment and Medication management  Disposition: Accepted for admission, seek placement at any facility with available beds.  Delfin Gant   PMHNP-BC 07/08/2015 1:07 PM  Patient seen face-to-face for psychiatric evaluation, chart reviewed and case discussed with the physician extender and developed treatment plan. Reviewed the information documented and agree with the treatment plan. Corena Pilgrim, MD

## 2015-07-09 DIAGNOSIS — L03116 Cellulitis of left lower limb: Secondary | ICD-10-CM | POA: Diagnosis not present

## 2015-07-09 DIAGNOSIS — Z23 Encounter for immunization: Secondary | ICD-10-CM | POA: Diagnosis not present

## 2015-07-09 DIAGNOSIS — F17210 Nicotine dependence, cigarettes, uncomplicated: Secondary | ICD-10-CM | POA: Diagnosis not present

## 2015-07-09 DIAGNOSIS — F419 Anxiety disorder, unspecified: Secondary | ICD-10-CM | POA: Diagnosis not present

## 2015-07-09 DIAGNOSIS — F258 Other schizoaffective disorders: Secondary | ICD-10-CM | POA: Diagnosis not present

## 2015-07-09 DIAGNOSIS — L03115 Cellulitis of right lower limb: Secondary | ICD-10-CM | POA: Diagnosis not present

## 2015-07-09 DIAGNOSIS — Z79899 Other long term (current) drug therapy: Secondary | ICD-10-CM | POA: Diagnosis not present

## 2015-07-09 DIAGNOSIS — F259 Schizoaffective disorder, unspecified: Secondary | ICD-10-CM | POA: Diagnosis not present

## 2015-07-09 DIAGNOSIS — I10 Essential (primary) hypertension: Secondary | ICD-10-CM | POA: Diagnosis not present

## 2015-07-09 DIAGNOSIS — Q6100 Congenital renal cyst, unspecified: Secondary | ICD-10-CM | POA: Diagnosis not present

## 2015-07-09 MED ORDER — LORAZEPAM 0.5 MG PO TABS
0.5000 mg | ORAL_TABLET | Freq: Two times a day (BID) | ORAL | Status: DC
  Administered 2015-07-09 – 2015-07-10 (×2): 0.5 mg via ORAL
  Filled 2015-07-09 (×2): qty 1

## 2015-07-09 MED ORDER — TRAZODONE HCL 50 MG PO TABS
50.0000 mg | ORAL_TABLET | Freq: Every evening | ORAL | Status: DC | PRN
Start: 2015-07-09 — End: 2015-07-10

## 2015-07-09 MED ORDER — QUETIAPINE FUMARATE 300 MG PO TABS
400.0000 mg | ORAL_TABLET | Freq: Two times a day (BID) | ORAL | Status: DC
  Administered 2015-07-09 – 2015-07-10 (×2): 400 mg via ORAL
  Filled 2015-07-09 (×2): qty 1

## 2015-07-09 NOTE — Progress Notes (Signed)
CSW spoke with Traci/ Intake at [facility]. She states that their facility can accept the patient tomorrow. She says that the patient is welcomed anytime tomorrow after 9:00am. The patient will be going to their [facility] in [address].  Accepting physician: Dr.Reedy  Report Number : [phone]  Willette Brace [id number] ED CSW 07/09/2015 7:23 PM

## 2015-07-09 NOTE — ED Notes (Signed)
Placed Leg bag on upper right leg.

## 2015-07-09 NOTE — BH Assessment (Signed)
Reassessment 07/09/2015:  Patient presents to [facility] with the following complaint: "Pt was accompanied by his wife, Steve Levy, during the assessment. Pt with depressed, anxious, ashamed, and frightened affect and mood. Pt indicated that he has just gotten "realy confused" and he "can't get any sleep". He also reported that he was "feeling lost and not sure what to do". Pt was clearly having difficulty formulating his thoughts and his wife assisted him in the assessment. Per Steve Levy, pt has not really slept in a week, when his symptoms began. Pt was not able to identify any trigger that caused his sudden state of confusion. Pt also shared that he has been having "bizarre thoughts" but was unable to elaborate as the thoughts were "too jumbled"."  Patient denies SI, HI, and AVH's today. Patient oriented to time, person, place, and situation. Patient appears extremely anxious with visual tremors. Patient sts that he does suffer from depression but feels much better today. Patient seems slightly confused as he answers minimum questions with a delay. He was however able to answer most questions appropriately.    Writer discussed with Dr. Darleene Cleaver and Reginold Agent, NP and per psychiatry team patient to remain in the ED for inpatient placement or [place] placement.

## 2015-07-09 NOTE — Progress Notes (Signed)
CSW reached out to [facility] to get accepting physician name, room number, and report number.  CSW informed admissions that [place] NP spoke with Dr. Fay Records who approved patient with conditions that the patient have a Leg bag  . However. Allision of admissions states that she needs to verify that the patient is admitted with Dr. Fay Records. Ebony Hail states that the condition would be exclusionary criteria.    Ebony Hail states even if Dr.Reedy does accept the patient. Their facility does not have any beds. However, she says that they may have beds tomorrow.  Ebony Hail states that she will discuss this patient with Dr.Reedy and call CSW back.  CSW made charge nurse aware.  Willette Brace [id number] ED CSW 07/09/2015 6:53 PM

## 2015-07-09 NOTE — BH Assessment (Signed)
[place] Assessment Progress Note  The following facilities have been contacted to seek placement for this pt, with results as noted:  Beds available, information sent, decision pending:  [facility]   Declined:  [facility] (medical acuity)   At capacity:  [facility], [place] Triage Specialist [phone]

## 2015-07-09 NOTE — ED Provider Notes (Signed)
Discussed patient with Reginold Agent psych NP. Patient needs to keep the Foley given that it took multiple tries and a coudet to get the foley placed. It needs to be kept for one week.  Unsure whether this si secondary to med effect. However given the difficulties trying to place it, I think he needs a leg bag for a week. He does not need foley care, just to keep the leg bag on.  We are trying to find placement for him.  Christia Domke Julio Alm, MD 07/09/15 1606

## 2015-07-09 NOTE — BH Assessment (Signed)
Patient accepted to [facility] by Dr. Reece Levy. Per Olivia Mackie, patient to be transported to [facility] anytime after [facility]. Nursing report # [phone]. Patient to be transported to [facility] via [facility] nurse-Jennifer notified of patient's disposition.

## 2015-07-09 NOTE — Consult Note (Signed)
Psychiatric Specialty Exam: Physical Exam  ROS  Blood pressure 126/79, pulse 88, temperature 98.6 F (37 C), temperature source Oral, resp. rate 14, SpO2 96 %.There is no weight on file to calculate BMI.  General Appearance: Casual  Eye Contact::  Good  Speech:  Clear and Coherent and Slow  Volume:  Normal  Mood:  Depressed  Affect:  Congruent and Depressed  Thought Process:  Coherent, Goal Directed and Intact  Orientation:  Full (Time, Place, and Person)  Thought Content:  WDL  Suicidal Thoughts:  No  Homicidal Thoughts:  No  Memory:  Immediate;   Fair Recent;   Fair Remote;   Fair  Judgement:  Poor  Insight:  Fair  Psychomotor Activity:  Normal  Concentration:  Fair  Recall:  [employer] of Knowledge:  Fair  Language:  Good  Akathisia:  No  Handed:  Right  AIMS (if indicated):     Assets:  Desire for Improvement  ADL's:  Intact  Cognition:  WNL  Sleep:      This provider evaluated patient today in his room in the main ER.  Patient participated in the interview.  He answered most questions asked of him promptly but still struggled to remember certain things.  He is oriented x4.  He reports that his memory is still not quite what it used to be.  Patient was brought in by his wife Monday night for lack of sleep and some bizarre thoughts.  Patient reports good sleep last night while in the ER.  He reports good appetite since he came in to the ER.  He now has a foley Catheter due to urinary retention.   He denies SI/HI/AVH.  He was calm and cooperative during the interview and had asked to be discharged home.  We will continue to monitor patient, offer medications while we continue to seek placement. Provider contacted [facility] for medications verification and to speak with DR Reece Levy his provider.  Sharyn Lull from the clinic called back and verified patient's medications and has agreed to discuss Patient's admission to [facility].  Schizoaffective disorder, chronic condition  with acute exacerbation ([place])   Plan: Continue seeking placement at any facility with inpatient Psychiatric bed, Repeat CMP and CBC in the morning and continue offering medications.  Charmaine Downs   PMHNP-BC  Addendum note:  Received a call from Westley Gambles from [facility] on behalf of Dr Reece Levy who reports that patient has been accepted by DR Reece [facility] to [facility] if patient will be coming with a leg back.  Patient is aware of his transfer to [facility] and the Foley is now converted to leg bag.  Patient seen face-to-face for psychiatric evaluation, chart reviewed and case discussed with the physician extender and developed treatment plan. Reviewed the information documented and agree with the treatment plan. Corena Pilgrim, MD

## 2015-07-10 DIAGNOSIS — R339 Retention of urine, unspecified: Secondary | ICD-10-CM | POA: Diagnosis not present

## 2015-07-10 DIAGNOSIS — L97529 Non-pressure chronic ulcer of other part of left foot with unspecified severity: Secondary | ICD-10-CM | POA: Diagnosis not present

## 2015-07-10 DIAGNOSIS — T83191A Other mechanical complication of urinary sphincter implant, initial encounter: Secondary | ICD-10-CM | POA: Diagnosis not present

## 2015-07-10 DIAGNOSIS — Z23 Encounter for immunization: Secondary | ICD-10-CM | POA: Diagnosis not present

## 2015-07-10 DIAGNOSIS — L03116 Cellulitis of left lower limb: Secondary | ICD-10-CM | POA: Diagnosis not present

## 2015-07-10 DIAGNOSIS — N138 Other obstructive and reflux uropathy: Secondary | ICD-10-CM | POA: Diagnosis not present

## 2015-07-10 DIAGNOSIS — T83098A Other mechanical complication of other indwelling urethral catheter, initial encounter: Secondary | ICD-10-CM | POA: Diagnosis not present

## 2015-07-10 DIAGNOSIS — F250 Schizoaffective disorder, bipolar type: Secondary | ICD-10-CM | POA: Diagnosis not present

## 2015-07-10 DIAGNOSIS — F419 Anxiety disorder, unspecified: Secondary | ICD-10-CM | POA: Diagnosis not present

## 2015-07-10 DIAGNOSIS — Q6100 Congenital renal cyst, unspecified: Secondary | ICD-10-CM | POA: Diagnosis not present

## 2015-07-10 DIAGNOSIS — F259 Schizoaffective disorder, unspecified: Secondary | ICD-10-CM | POA: Diagnosis not present

## 2015-07-10 DIAGNOSIS — I10 Essential (primary) hypertension: Secondary | ICD-10-CM | POA: Diagnosis not present

## 2015-07-10 DIAGNOSIS — Z79899 Other long term (current) drug therapy: Secondary | ICD-10-CM | POA: Diagnosis not present

## 2015-07-10 DIAGNOSIS — L97519 Non-pressure chronic ulcer of other part of right foot with unspecified severity: Secondary | ICD-10-CM | POA: Diagnosis not present

## 2015-07-10 DIAGNOSIS — F258 Other schizoaffective disorders: Secondary | ICD-10-CM | POA: Diagnosis not present

## 2015-07-10 DIAGNOSIS — N139 Obstructive and reflux uropathy, unspecified: Secondary | ICD-10-CM | POA: Diagnosis not present

## 2015-07-10 DIAGNOSIS — N401 Enlarged prostate with lower urinary tract symptoms: Secondary | ICD-10-CM | POA: Diagnosis not present

## 2015-07-10 DIAGNOSIS — F17210 Nicotine dependence, cigarettes, uncomplicated: Secondary | ICD-10-CM | POA: Diagnosis not present

## 2015-07-10 DIAGNOSIS — N3289 Other specified disorders of bladder: Secondary | ICD-10-CM | POA: Diagnosis not present

## 2015-07-10 DIAGNOSIS — L03115 Cellulitis of right lower limb: Secondary | ICD-10-CM | POA: Diagnosis not present

## 2015-07-10 LAB — CBC WITH DIFFERENTIAL/PLATELET
BASOS ABS: 0 10*3/uL (ref 0.0–0.1)
BASOS PCT: 0 %
EOS ABS: 0.2 10*3/uL (ref 0.0–0.7)
EOS PCT: 3 %
HCT: 47.7 % (ref 39.0–52.0)
HEMOGLOBIN: 16 g/dL (ref 13.0–17.0)
Lymphocytes Relative: 16 %
Lymphs Abs: 1.2 10*3/uL (ref 0.7–4.0)
MCH: 31.3 pg (ref 26.0–34.0)
MCHC: 33.5 g/dL (ref 30.0–36.0)
MCV: 93.2 fL (ref 78.0–100.0)
Monocytes Absolute: 0.8 10*3/uL (ref 0.1–1.0)
Monocytes Relative: 10 %
NEUTROS PCT: 71 %
Neutro Abs: 5.3 10*3/uL (ref 1.7–7.7)
PLATELETS: 216 10*3/uL (ref 150–400)
RBC: 5.12 MIL/uL (ref 4.22–5.81)
RDW: 12.8 % (ref 11.5–15.5)
WBC: 7.6 10*3/uL (ref 4.0–10.5)

## 2015-07-10 LAB — COMPREHENSIVE METABOLIC PANEL
ALBUMIN: 4.1 g/dL (ref 3.5–5.0)
ALT: 23 U/L (ref 17–63)
ANION GAP: 11 (ref 5–15)
AST: 28 U/L (ref 15–41)
Alkaline Phosphatase: 64 U/L (ref 38–126)
BUN: 18 mg/dL (ref 6–20)
CALCIUM: 9.2 mg/dL (ref 8.9–10.3)
CHLORIDE: 100 mmol/L — AB (ref 101–111)
CO2: 27 mmol/L (ref 22–32)
CREATININE: 1.13 mg/dL (ref 0.61–1.24)
GFR calc Af Amer: >60 mL/min (ref >60)
Glucose, Bld: 89 mg/dL (ref 65–99)
Potassium: 3.4 mmol/L — ABNORMAL LOW (ref 3.5–5.1)
Sodium: 138 mmol/L (ref 135–145)
TOTAL PROTEIN: 7.8 g/dL (ref 6.5–8.1)
Total Bilirubin: 0.8 mg/dL (ref 0.3–1.2)

## 2015-07-10 NOTE — ED Notes (Signed)
Pt had no belongings to send w/ [facility].

## 2015-07-10 NOTE — ED Notes (Signed)
Pt refusing lab work.  Designer, multimedia aware.

## 2015-07-10 NOTE — ED Notes (Signed)
Pt reports that he removed the coude catheter yesterday and did not notify staff.  Denies current pain and/or bleeding.  Pt reports that he has voided several times, since removing catheter.  EDP made aware and cleared Pt to be transferred.

## 2015-07-11 DIAGNOSIS — T83191A Other mechanical complication of urinary sphincter implant, initial encounter: Secondary | ICD-10-CM | POA: Diagnosis not present

## 2015-07-11 DIAGNOSIS — N138 Other obstructive and reflux uropathy: Secondary | ICD-10-CM | POA: Diagnosis not present

## 2015-07-11 DIAGNOSIS — F258 Other schizoaffective disorders: Secondary | ICD-10-CM | POA: Diagnosis not present

## 2015-07-11 DIAGNOSIS — N401 Enlarged prostate with lower urinary tract symptoms: Secondary | ICD-10-CM | POA: Diagnosis not present

## 2015-07-11 DIAGNOSIS — I10 Essential (primary) hypertension: Secondary | ICD-10-CM | POA: Diagnosis not present

## 2015-07-11 DIAGNOSIS — T83098A Other mechanical complication of other indwelling urethral catheter, initial encounter: Secondary | ICD-10-CM | POA: Diagnosis not present

## 2015-07-11 DIAGNOSIS — N139 Obstructive and reflux uropathy, unspecified: Secondary | ICD-10-CM | POA: Diagnosis not present

## 2015-07-16 DIAGNOSIS — N281 Cyst of kidney, acquired: Secondary | ICD-10-CM | POA: Diagnosis not present

## 2015-07-16 DIAGNOSIS — R338 Other retention of urine: Secondary | ICD-10-CM | POA: Diagnosis not present

## 2015-07-16 DIAGNOSIS — N400 Enlarged prostate without lower urinary tract symptoms: Secondary | ICD-10-CM | POA: Diagnosis not present

## 2015-07-16 DIAGNOSIS — R972 Elevated prostate specific antigen [PSA]: Secondary | ICD-10-CM | POA: Diagnosis not present

## 2015-07-24 DIAGNOSIS — F250 Schizoaffective disorder, bipolar type: Secondary | ICD-10-CM | POA: Diagnosis not present

## 2015-08-07 DIAGNOSIS — F259 Schizoaffective disorder, unspecified: Secondary | ICD-10-CM | POA: Diagnosis not present

## 2015-08-07 DIAGNOSIS — N401 Enlarged prostate with lower urinary tract symptoms: Secondary | ICD-10-CM | POA: Diagnosis not present

## 2015-08-07 DIAGNOSIS — Z79899 Other long term (current) drug therapy: Secondary | ICD-10-CM | POA: Diagnosis not present

## 2015-08-07 DIAGNOSIS — Z23 Encounter for immunization: Secondary | ICD-10-CM | POA: Diagnosis not present

## 2015-08-07 DIAGNOSIS — I10 Essential (primary) hypertension: Secondary | ICD-10-CM | POA: Diagnosis not present

## 2015-10-20 DIAGNOSIS — R972 Elevated prostate specific antigen [PSA]: Secondary | ICD-10-CM | POA: Diagnosis not present

## 2015-10-22 DIAGNOSIS — F250 Schizoaffective disorder, bipolar type: Secondary | ICD-10-CM | POA: Diagnosis not present

## 2016-01-06 DIAGNOSIS — N400 Enlarged prostate without lower urinary tract symptoms: Secondary | ICD-10-CM | POA: Diagnosis not present

## 2016-01-13 DIAGNOSIS — R338 Other retention of urine: Secondary | ICD-10-CM | POA: Diagnosis not present

## 2016-01-13 DIAGNOSIS — R972 Elevated prostate specific antigen [PSA]: Secondary | ICD-10-CM | POA: Diagnosis not present

## 2016-01-13 DIAGNOSIS — Z0000 Encounter for general adult medical examination without abnormal findings: Secondary | ICD-10-CM | POA: Diagnosis not present

## 2016-01-13 DIAGNOSIS — N400 Enlarged prostate without lower urinary tract symptoms: Secondary | ICD-10-CM | POA: Diagnosis not present

## 2016-01-20 DIAGNOSIS — R972 Elevated prostate specific antigen [PSA]: Secondary | ICD-10-CM | POA: Diagnosis not present

## 2016-01-27 DIAGNOSIS — R972 Elevated prostate specific antigen [PSA]: Secondary | ICD-10-CM | POA: Diagnosis not present

## 2016-02-09 DIAGNOSIS — Z6827 Body mass index (BMI) 27.0-27.9, adult: Secondary | ICD-10-CM | POA: Diagnosis not present

## 2016-02-09 DIAGNOSIS — F259 Schizoaffective disorder, unspecified: Secondary | ICD-10-CM | POA: Diagnosis not present

## 2016-02-09 DIAGNOSIS — Z1389 Encounter for screening for other disorder: Secondary | ICD-10-CM | POA: Diagnosis not present

## 2016-02-09 DIAGNOSIS — N401 Enlarged prostate with lower urinary tract symptoms: Secondary | ICD-10-CM | POA: Diagnosis not present

## 2016-02-09 DIAGNOSIS — I10 Essential (primary) hypertension: Secondary | ICD-10-CM | POA: Diagnosis not present

## 2016-02-09 DIAGNOSIS — Z79899 Other long term (current) drug therapy: Secondary | ICD-10-CM | POA: Diagnosis not present

## 2016-02-09 DIAGNOSIS — Z0000 Encounter for general adult medical examination without abnormal findings: Secondary | ICD-10-CM | POA: Diagnosis not present

## 2016-05-12 DIAGNOSIS — F250 Schizoaffective disorder, bipolar type: Secondary | ICD-10-CM | POA: Diagnosis not present

## 2016-09-02 DIAGNOSIS — R972 Elevated prostate specific antigen [PSA]: Secondary | ICD-10-CM | POA: Diagnosis not present

## 2016-09-02 DIAGNOSIS — I10 Essential (primary) hypertension: Secondary | ICD-10-CM | POA: Diagnosis not present

## 2016-09-02 DIAGNOSIS — Z79899 Other long term (current) drug therapy: Secondary | ICD-10-CM | POA: Diagnosis not present

## 2016-11-15 DIAGNOSIS — H2513 Age-related nuclear cataract, bilateral: Secondary | ICD-10-CM | POA: Diagnosis not present

## 2016-11-15 DIAGNOSIS — H40033 Anatomical narrow angle, bilateral: Secondary | ICD-10-CM | POA: Diagnosis not present

## 2016-12-09 DIAGNOSIS — F250 Schizoaffective disorder, bipolar type: Secondary | ICD-10-CM | POA: Diagnosis not present

## 2016-12-10 DIAGNOSIS — S0501XA Injury of conjunctiva and corneal abrasion without foreign body, right eye, initial encounter: Secondary | ICD-10-CM | POA: Diagnosis not present

## 2016-12-10 DIAGNOSIS — Z79899 Other long term (current) drug therapy: Secondary | ICD-10-CM | POA: Diagnosis not present

## 2016-12-10 DIAGNOSIS — I10 Essential (primary) hypertension: Secondary | ICD-10-CM | POA: Diagnosis not present

## 2017-01-18 DIAGNOSIS — N5201 Erectile dysfunction due to arterial insufficiency: Secondary | ICD-10-CM | POA: Diagnosis not present

## 2017-01-18 DIAGNOSIS — R972 Elevated prostate specific antigen [PSA]: Secondary | ICD-10-CM | POA: Diagnosis not present

## 2017-03-02 DIAGNOSIS — Z23 Encounter for immunization: Secondary | ICD-10-CM | POA: Diagnosis not present

## 2017-03-02 DIAGNOSIS — N401 Enlarged prostate with lower urinary tract symptoms: Secondary | ICD-10-CM | POA: Diagnosis not present

## 2017-03-02 DIAGNOSIS — M7552 Bursitis of left shoulder: Secondary | ICD-10-CM | POA: Diagnosis not present

## 2017-03-02 DIAGNOSIS — Z1389 Encounter for screening for other disorder: Secondary | ICD-10-CM | POA: Diagnosis not present

## 2017-03-02 DIAGNOSIS — I10 Essential (primary) hypertension: Secondary | ICD-10-CM | POA: Diagnosis not present

## 2017-03-02 DIAGNOSIS — Z79899 Other long term (current) drug therapy: Secondary | ICD-10-CM | POA: Diagnosis not present

## 2017-03-02 DIAGNOSIS — Z0000 Encounter for general adult medical examination without abnormal findings: Secondary | ICD-10-CM | POA: Diagnosis not present

## 2017-03-02 DIAGNOSIS — Z6827 Body mass index (BMI) 27.0-27.9, adult: Secondary | ICD-10-CM | POA: Diagnosis not present

## 2017-03-09 DIAGNOSIS — R69 Illness, unspecified: Secondary | ICD-10-CM | POA: Diagnosis not present

## 2017-03-18 DIAGNOSIS — M7552 Bursitis of left shoulder: Secondary | ICD-10-CM | POA: Diagnosis not present

## 2017-03-18 DIAGNOSIS — Z79899 Other long term (current) drug therapy: Secondary | ICD-10-CM | POA: Diagnosis not present

## 2017-07-08 DIAGNOSIS — R972 Elevated prostate specific antigen [PSA]: Secondary | ICD-10-CM | POA: Diagnosis not present

## 2017-07-19 DIAGNOSIS — R972 Elevated prostate specific antigen [PSA]: Secondary | ICD-10-CM | POA: Diagnosis not present

## 2017-07-19 DIAGNOSIS — N400 Enlarged prostate without lower urinary tract symptoms: Secondary | ICD-10-CM | POA: Diagnosis not present

## 2017-08-24 IMAGING — CR DG FOOT COMPLETE 3+V*R*
3 series · 3 of 3 positions shown · non-contrast
Comparison: None.

CLINICAL DATA: Redness and pain to right and left feet. Per ED
notes wound to lt foot s/p to walking [REDACTED]ed [place] night on
railroad tracks. Cellulitis to bilateral feet.

EXAM:
RIGHT FOOT COMPLETE - 3+ VIEW

[x foot ap right]
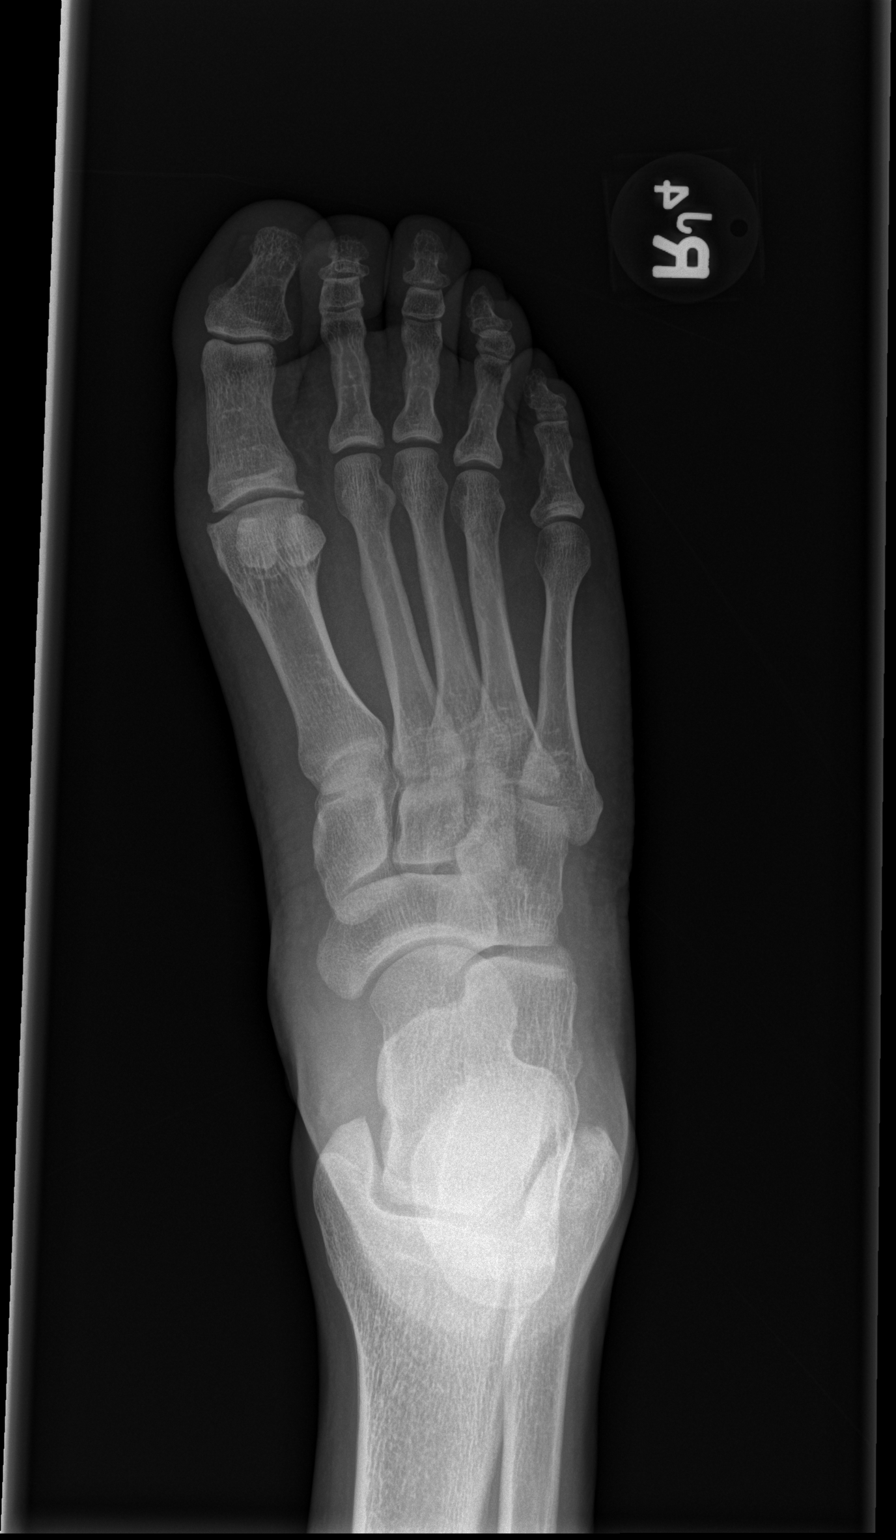

[x foot obl right]
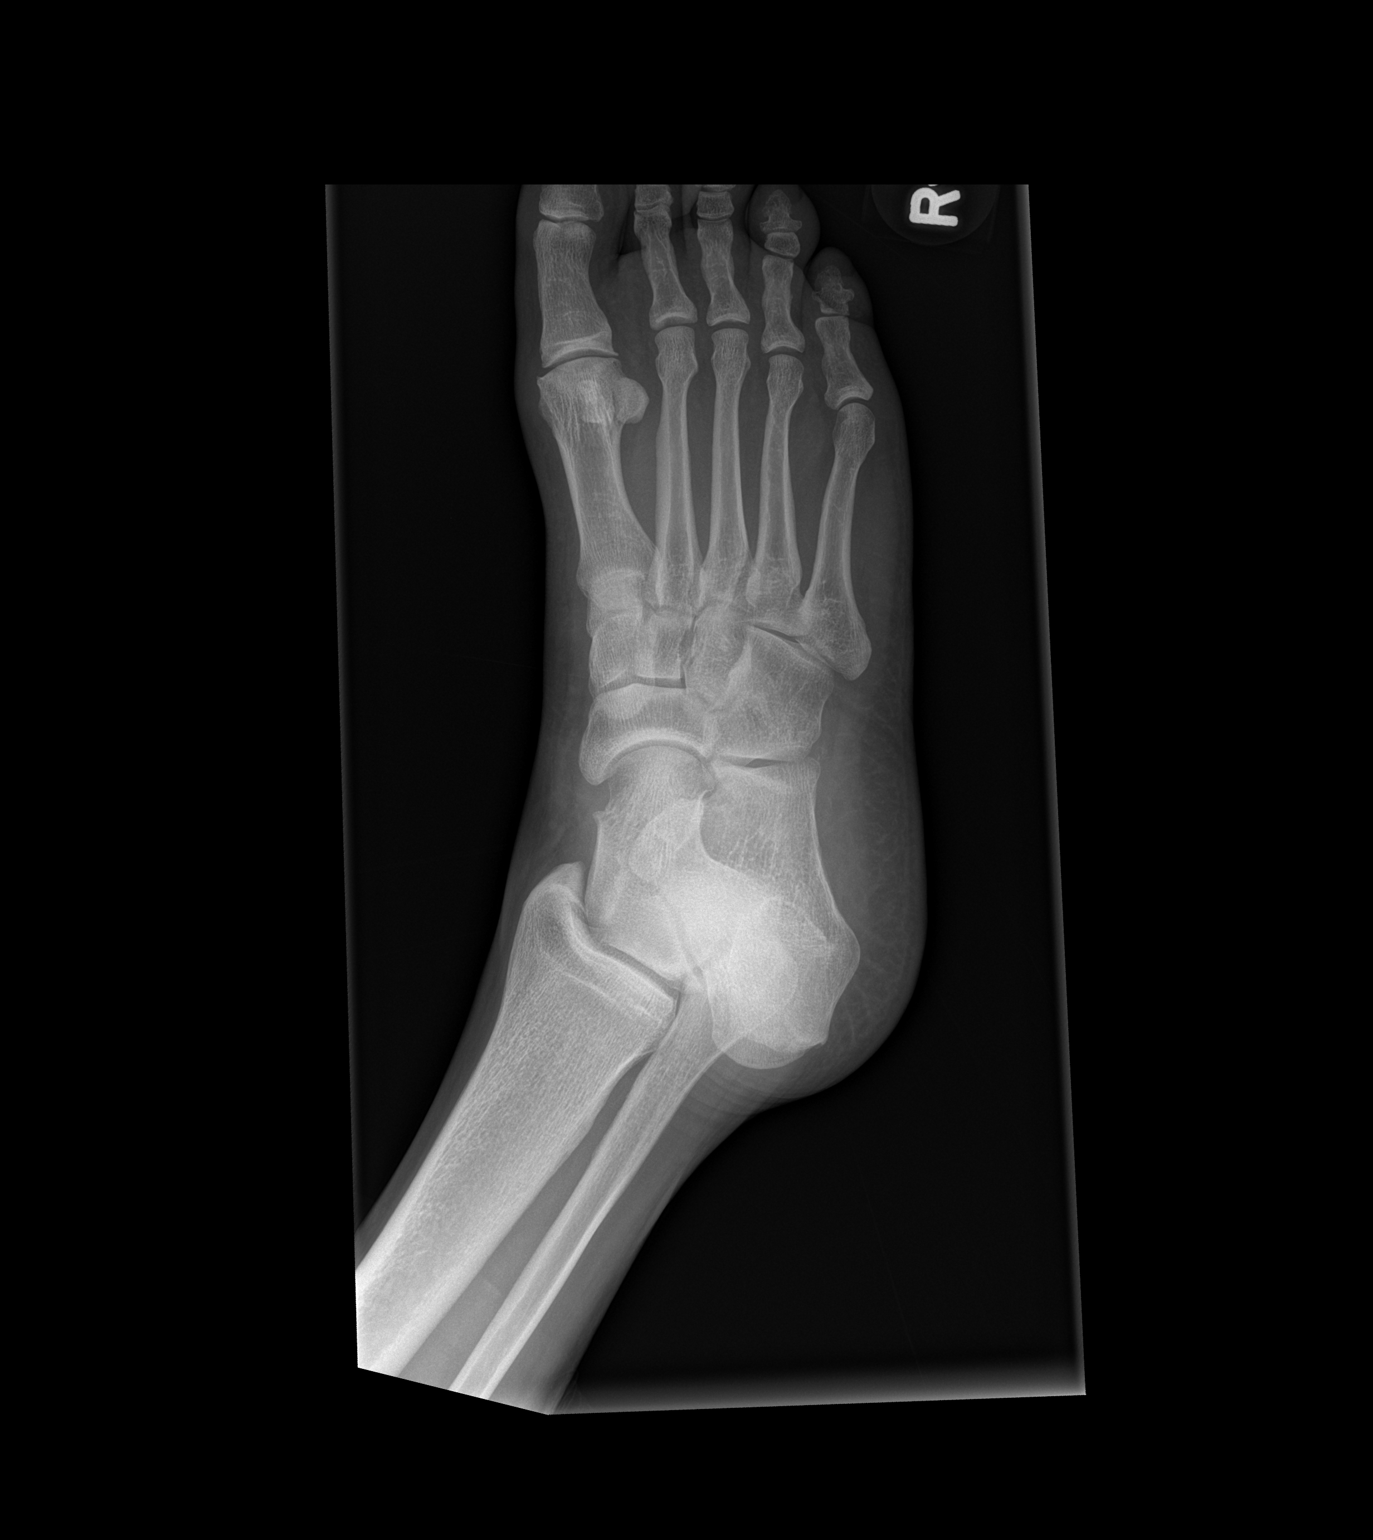

[x foot lat right]
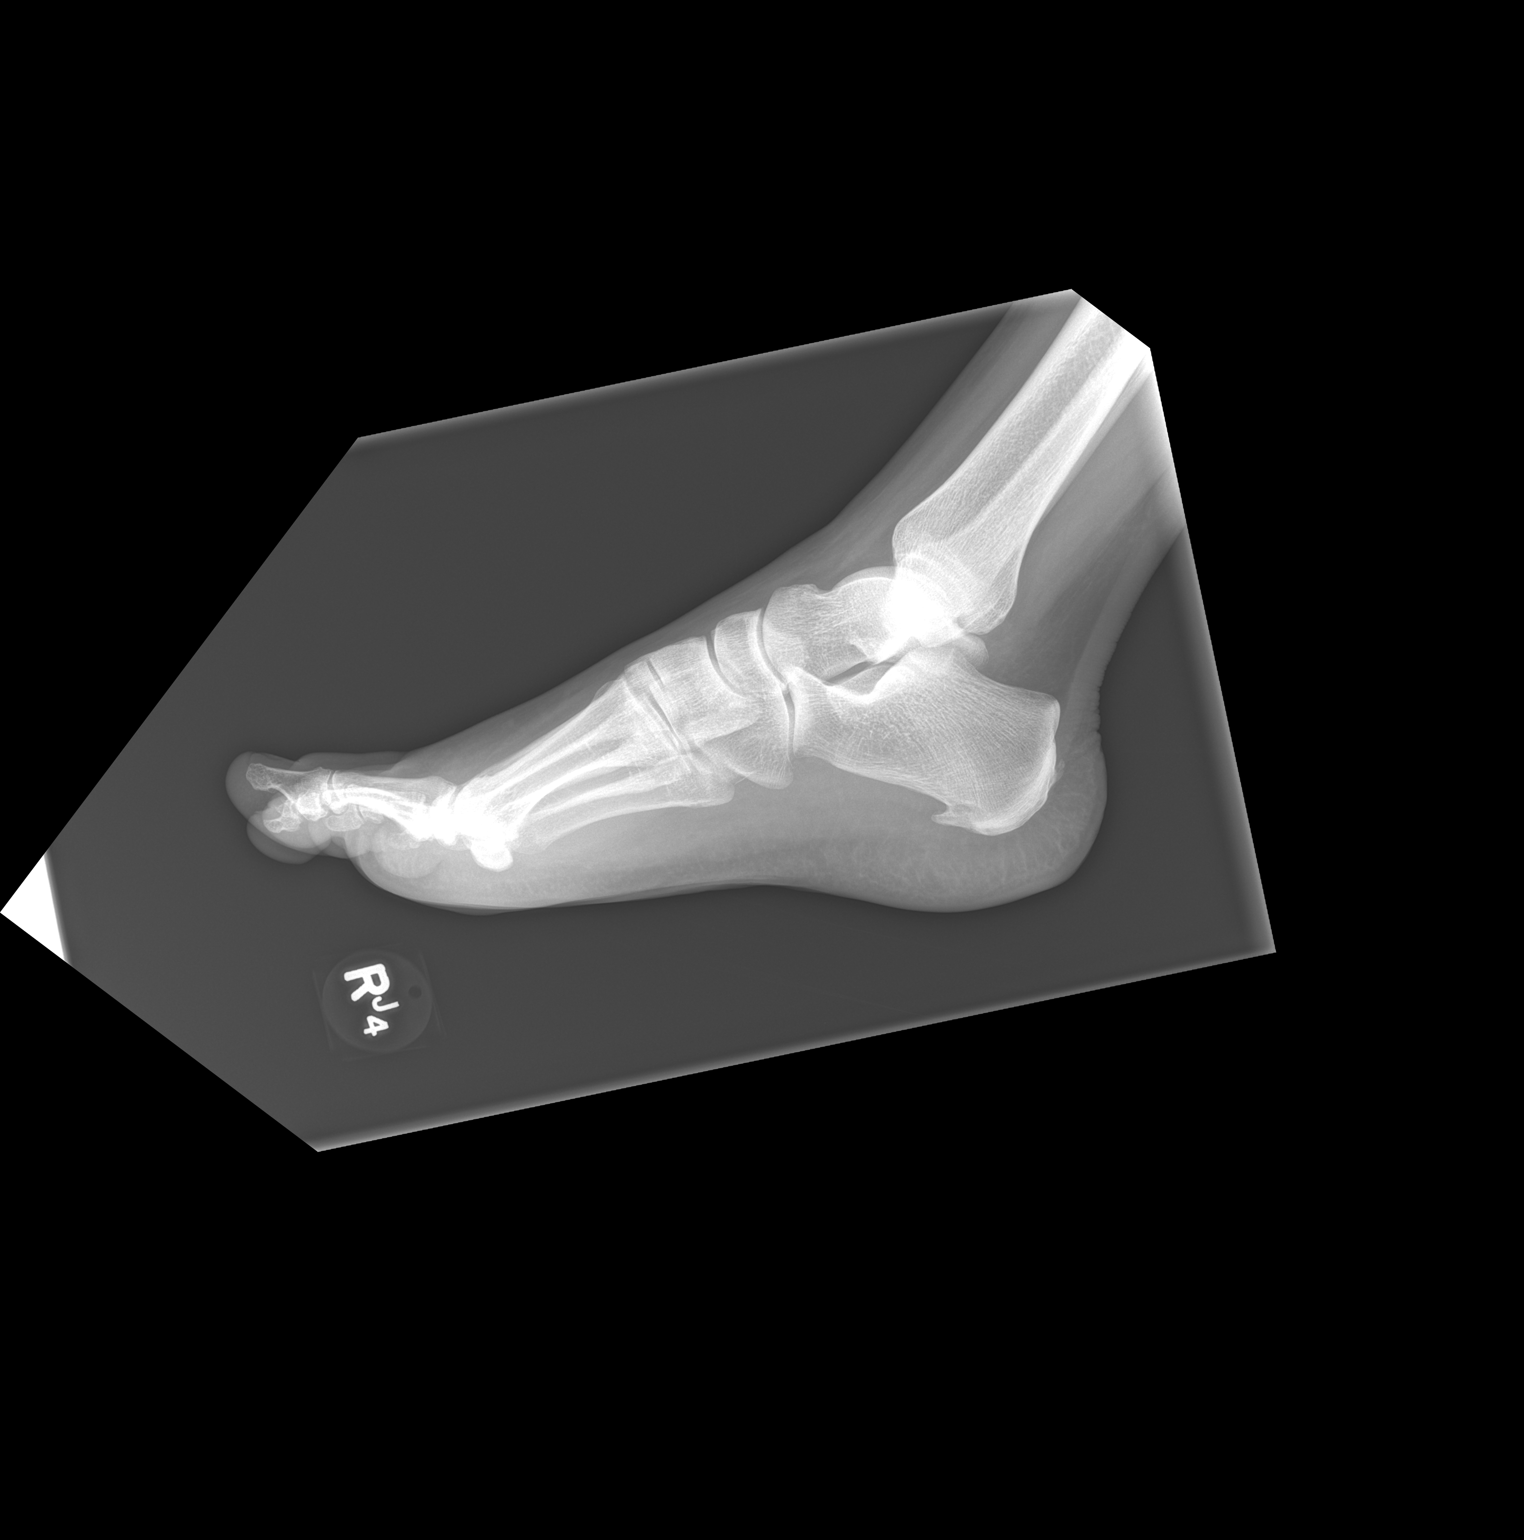

[3 of 3 positions shown; findings below may reference images not displayed]

FINDINGS: There is no evidence of fracture or dislocation. Small calcaneal
spurs. There is no evidence of arthropathy or other focal bone
abnormality. Soft tissues are unremarkable.
IMPRESSION: 1. No acute abnormality or fracture.  Small calcaneal spurs.

## 2017-09-13 DIAGNOSIS — Z79899 Other long term (current) drug therapy: Secondary | ICD-10-CM | POA: Diagnosis not present

## 2017-09-13 DIAGNOSIS — Z6828 Body mass index (BMI) 28.0-28.9, adult: Secondary | ICD-10-CM | POA: Diagnosis not present

## 2017-09-13 DIAGNOSIS — I10 Essential (primary) hypertension: Secondary | ICD-10-CM | POA: Diagnosis not present

## 2017-09-13 DIAGNOSIS — F259 Schizoaffective disorder, unspecified: Secondary | ICD-10-CM | POA: Diagnosis not present

## 2017-09-14 DIAGNOSIS — F250 Schizoaffective disorder, bipolar type: Secondary | ICD-10-CM | POA: Diagnosis not present

## 2017-10-19 DIAGNOSIS — M19012 Primary osteoarthritis, left shoulder: Secondary | ICD-10-CM | POA: Diagnosis not present

## 2017-10-19 DIAGNOSIS — M545 Low back pain: Secondary | ICD-10-CM | POA: Diagnosis not present

## 2017-11-02 DIAGNOSIS — M545 Low back pain: Secondary | ICD-10-CM | POA: Diagnosis not present

## 2017-11-02 DIAGNOSIS — M19012 Primary osteoarthritis, left shoulder: Secondary | ICD-10-CM | POA: Diagnosis not present

## 2018-01-17 DIAGNOSIS — R972 Elevated prostate specific antigen [PSA]: Secondary | ICD-10-CM | POA: Diagnosis not present

## 2018-03-13 DIAGNOSIS — R338 Other retention of urine: Secondary | ICD-10-CM | POA: Diagnosis not present

## 2018-03-20 DIAGNOSIS — R338 Other retention of urine: Secondary | ICD-10-CM | POA: Diagnosis not present

## 2018-04-19 DIAGNOSIS — F259 Schizoaffective disorder, unspecified: Secondary | ICD-10-CM | POA: Diagnosis not present

## 2018-04-19 DIAGNOSIS — E663 Overweight: Secondary | ICD-10-CM | POA: Diagnosis not present

## 2018-04-19 DIAGNOSIS — Z1331 Encounter for screening for depression: Secondary | ICD-10-CM | POA: Diagnosis not present

## 2018-04-19 DIAGNOSIS — Z1339 Encounter for screening examination for other mental health and behavioral disorders: Secondary | ICD-10-CM | POA: Diagnosis not present

## 2018-04-19 DIAGNOSIS — N401 Enlarged prostate with lower urinary tract symptoms: Secondary | ICD-10-CM | POA: Diagnosis not present

## 2018-04-19 DIAGNOSIS — Z79899 Other long term (current) drug therapy: Secondary | ICD-10-CM | POA: Diagnosis not present

## 2018-04-19 DIAGNOSIS — Z6828 Body mass index (BMI) 28.0-28.9, adult: Secondary | ICD-10-CM | POA: Diagnosis not present

## 2018-04-19 DIAGNOSIS — I10 Essential (primary) hypertension: Secondary | ICD-10-CM | POA: Diagnosis not present

## 2018-07-04 DIAGNOSIS — F250 Schizoaffective disorder, bipolar type: Secondary | ICD-10-CM | POA: Diagnosis not present

## 2018-07-18 DIAGNOSIS — R972 Elevated prostate specific antigen [PSA]: Secondary | ICD-10-CM | POA: Diagnosis not present

## 2018-07-25 ENCOUNTER — Other Ambulatory Visit: Payer: Self-pay | Admitting: Urology

## 2018-07-25 DIAGNOSIS — R972 Elevated prostate specific antigen [PSA]: Secondary | ICD-10-CM | POA: Diagnosis not present

## 2018-07-25 DIAGNOSIS — N401 Enlarged prostate with lower urinary tract symptoms: Secondary | ICD-10-CM | POA: Diagnosis not present

## 2018-07-25 DIAGNOSIS — R3914 Feeling of incomplete bladder emptying: Secondary | ICD-10-CM | POA: Diagnosis not present

## 2018-08-08 ENCOUNTER — Ambulatory Visit
Admission: RE | Admit: 2018-08-08 | Discharge: 2018-08-08 | Disposition: A | Discharge status: Home / Self Care | Payer: Commercial Managed Care - HMO | Source: Ambulatory Visit | Attending: Urology | Admitting: Urology

## 2018-08-08 DIAGNOSIS — C61 Malignant neoplasm of prostate: Secondary | ICD-10-CM | POA: Diagnosis not present

## 2018-08-08 DIAGNOSIS — R972 Elevated prostate specific antigen [PSA]: Secondary | ICD-10-CM

## 2018-08-08 MED ORDER — GADOBENATE DIMEGLUMINE 529 MG/ML IV SOLN
17.0000 mL | Freq: Once | INTRAVENOUS | Status: AC | PRN
  Administered 2018-08-08: 17 mL via INTRAVENOUS

## 2018-09-07 DIAGNOSIS — R972 Elevated prostate specific antigen [PSA]: Secondary | ICD-10-CM | POA: Diagnosis not present

## 2018-09-14 DIAGNOSIS — R972 Elevated prostate specific antigen [PSA]: Secondary | ICD-10-CM | POA: Diagnosis not present

## 2018-10-23 DIAGNOSIS — R7989 Other specified abnormal findings of blood chemistry: Secondary | ICD-10-CM | POA: Diagnosis not present

## 2018-10-23 DIAGNOSIS — F259 Schizoaffective disorder, unspecified: Secondary | ICD-10-CM | POA: Diagnosis not present

## 2018-10-23 DIAGNOSIS — N401 Enlarged prostate with lower urinary tract symptoms: Secondary | ICD-10-CM | POA: Diagnosis not present

## 2018-10-23 DIAGNOSIS — Z23 Encounter for immunization: Secondary | ICD-10-CM | POA: Diagnosis not present

## 2018-10-23 DIAGNOSIS — Z79899 Other long term (current) drug therapy: Secondary | ICD-10-CM | POA: Diagnosis not present

## 2018-10-23 DIAGNOSIS — I10 Essential (primary) hypertension: Secondary | ICD-10-CM | POA: Diagnosis not present

## 2018-10-23 DIAGNOSIS — R000 Tachycardia, unspecified: Secondary | ICD-10-CM | POA: Diagnosis not present

## 2019-01-02 DIAGNOSIS — F250 Schizoaffective disorder, bipolar type: Secondary | ICD-10-CM | POA: Diagnosis not present

## 2019-01-24 DIAGNOSIS — I10 Essential (primary) hypertension: Secondary | ICD-10-CM | POA: Diagnosis not present

## 2019-01-24 DIAGNOSIS — Z6829 Body mass index (BMI) 29.0-29.9, adult: Secondary | ICD-10-CM | POA: Diagnosis not present

## 2019-01-24 DIAGNOSIS — Z87891 Personal history of nicotine dependence: Secondary | ICD-10-CM | POA: Diagnosis not present

## 2019-01-24 DIAGNOSIS — F259 Schizoaffective disorder, unspecified: Secondary | ICD-10-CM | POA: Diagnosis not present

## 2019-01-24 DIAGNOSIS — Z1331 Encounter for screening for depression: Secondary | ICD-10-CM | POA: Diagnosis not present

## 2019-01-24 DIAGNOSIS — R000 Tachycardia, unspecified: Secondary | ICD-10-CM | POA: Diagnosis not present

## 2019-01-31 DIAGNOSIS — M542 Cervicalgia: Secondary | ICD-10-CM | POA: Diagnosis not present

## 2019-01-31 DIAGNOSIS — M19012 Primary osteoarthritis, left shoulder: Secondary | ICD-10-CM | POA: Diagnosis not present

## 2019-02-01 ENCOUNTER — Other Ambulatory Visit: Payer: Self-pay | Admitting: Orthopedic Surgery

## 2019-02-01 DIAGNOSIS — M25512 Pain in left shoulder: Secondary | ICD-10-CM

## 2019-02-08 ENCOUNTER — Ambulatory Visit
Admission: RE | Admit: 2019-02-08 | Discharge: 2019-02-08 | Disposition: A | Discharge status: Home / Self Care | Payer: Medicare HMO | Source: Ambulatory Visit | Attending: Orthopedic Surgery | Admitting: Orthopedic Surgery

## 2019-02-08 DIAGNOSIS — M19012 Primary osteoarthritis, left shoulder: Secondary | ICD-10-CM | POA: Diagnosis not present

## 2019-02-08 DIAGNOSIS — M25712 Osteophyte, left shoulder: Secondary | ICD-10-CM | POA: Diagnosis not present

## 2019-02-08 DIAGNOSIS — M24012 Loose body in left shoulder: Secondary | ICD-10-CM | POA: Diagnosis not present

## 2019-02-08 DIAGNOSIS — M25512 Pain in left shoulder: Secondary | ICD-10-CM

## 2019-03-01 ENCOUNTER — Other Ambulatory Visit (HOSPITAL_COMMUNITY): Payer: Self-pay | Admitting: *Deleted

## 2019-03-01 NOTE — Patient Instructions (Addendum)
YOU NEED TO HAVE A COVID 19 TEST ON 03-09-19  @ 3:20 PM, THIS TEST MUST BE DONE BEFORE SURGERY, COME TO [facility] ENTRANCE. ONCE YOUR COVID TEST IS COMPLETED, PLEASE BEGIN THE QUARANTINE INSTRUCTIONS AS OUTLINED IN YOUR HANDOUT.                Steve Levy    Your procedure is scheduled on:03-13-2019    Report to [facility] Main  Entrance    Report to admitting at 7:35 AM    Call this number if you have problems the morning of surgery [phone]    Remember:   NO SOLID FOOD AFTER MIDNIGHT THE NIGHT PRIOR TO SURGERY. NOTHING BY MOUTH EXCEPT CLEAR LIQUIDS UNTIL 7:00 AM.. PLEASE FINISH ENSURE DRINK PER SURGEON ORDER 3 HOURS PRIOR TO SCHEDULED SURGERY TIME WHICH NEEDS TO BE COMPLETED AT 7:00 AM   CLEAR LIQUID DIET   Foods Allowed                                                                     Foods Excluded  Coffee and tea, regular and decaf                             liquids that you cannot  Plain Jell-O in any flavor                                             see through such as: Fruit ices (not with fruit pulp)                                     milk, soups, orange juice  Iced Popsicles                                    All solid food Carbonated beverages, regular and diet                                    Cranberry, grape and apple juices Sports drinks like Gatorade Lightly seasoned clear broth or consume(fat free) Sugar, honey syrup  Sample Menu Breakfast                                Lunch                                     Supper Cranberry juice                    Beef broth                            Chicken broth Jell-O  Grape juice                           Apple juice Coffee or tea                        Jell-O                                      Popsicle                                                Coffee or tea                        Coffee or  tea  _____________________________________________________________________     Take these medicines the morning of surgery with A SIP OF WATER: None   BRUSH YOUR TEETH MORNING OF SURGERY AND RINSE YOUR MOUTH OUT, NO CHEWING GUM CANDY OR MINTS.              You may not have any metal on your body including hair pins and              piercings     Do not wear jewelry, cologne, lotions, powders or deodorant                          Men may shave face and neck.   Do not bring valuables to the hospital. [place].  Contacts, dentures or bridgework may not be worn into surgery.       _____________________________________________________________________             [facility] - Preparing for Surgery Before surgery, you can play an important role.  Because skin is not sterile, your skin needs to be as free of germs as possible.  You can reduce the number of germs on your skin by washing with CHG (chlorahexidine gluconate) soap before surgery.  CHG is an antiseptic cleaner which kills germs and bonds with the skin to continue killing germs even after washing. Please DO NOT use if you have an allergy to CHG or antibacterial soaps.  If your skin becomes reddened/irritated stop using the CHG and inform your nurse when you arrive at Short Stay. Do not shave (including legs and underarms) for at least 48 hours prior to the first CHG shower.  You may shave your face/neck. Please follow these instructions carefully:  1.  Shower with CHG Soap the night before surgery and the  morning of Surgery.  2.  If you choose to wash your hair, wash your hair first as usual with your  normal  shampoo.  3.  After you shampoo, rinse your hair and body thoroughly to remove the  shampoo.                           4.  Use CHG as you would any other liquid soap.  You can apply chg directly  to the skin and wash  Gently with a scrungie or clean  washcloth.  5.  Apply the CHG Soap to your body ONLY FROM THE NECK DOWN.   Do not use on face/ open                           Wound or open sores. Avoid contact with eyes, ears mouth and genitals (private parts).                       Wash face,  Genitals (private parts) with your normal soap.             6.  Wash thoroughly, paying special attention to the area where your surgery  will be performed.  7.  Thoroughly rinse your body with warm water from the neck down.  8.  DO NOT shower/wash with your normal soap after using and rinsing off  the CHG Soap.                9.  Pat yourself dry with a clean towel.            10.  Wear clean pajamas.            11.  Place clean sheets on your bed the night of your first shower and do not  sleep with pets. Day of Surgery : Do not apply any lotions/deodorants the morning of surgery.  Please wear clean clothes to the hospital/surgery center.  FAILURE TO FOLLOW THESE INSTRUCTIONS MAY RESULT IN THE CANCELLATION OF YOUR SURGERY PATIENT SIGNATURE_________________________________  NURSE SIGNATURE__________________________________  ________________________________________________________________________   Adam Phenix  An incentive spirometer is a tool that can help keep your lungs clear and active. This tool measures how well you are filling your lungs with each breath. Taking long deep breaths may help reverse or decrease the chance of developing breathing (pulmonary) problems (especially infection) following:  A long period of time when you are unable to move or be active. BEFORE THE PROCEDURE   If the spirometer includes an indicator to show your best effort, your nurse or respiratory therapist will set it to a desired goal.  If possible, sit up straight or lean slightly forward. Try not to slouch.  Hold the incentive spirometer in an upright position. INSTRUCTIONS FOR USE  1. Sit on the edge of your bed if possible, or sit up as far  as you can in bed or on a chair. 2. Hold the incentive spirometer in an upright position. 3. Breathe out normally. 4. Place the mouthpiece in your mouth and seal your lips tightly around it. 5. Breathe in slowly and as deeply as possible, raising the piston or the ball toward the top of the column. 6. Hold your breath for 3-5 seconds or for as long as possible. Allow the piston or ball to fall to the bottom of the column. 7. Remove the mouthpiece from your mouth and breathe out normally. 8. Rest for a few seconds and repeat Steps 1 through 7 at least 10 times every 1-2 hours when you are awake. Take your time and take a few normal breaths between deep breaths. 9. The spirometer may include an indicator to show your best effort. Use the indicator as a goal to work toward during each repetition. 10. After each set of 10 deep breaths, practice coughing to be sure your lungs are clear. If you have an incision (the cut made at the time of  surgery), support your incision when coughing by placing a pillow or rolled up towels firmly against it. Once you are able to get out of bed, walk around indoors and cough well. You may stop using the incentive spirometer when instructed by your caregiver.  RISKS AND COMPLICATIONS  Take your time so you do not get dizzy or light-headed.  If you are in pain, you may need to take or ask for pain medication before doing incentive spirometry. It is harder to take a deep breath if you are having pain. AFTER USE  Rest and breathe slowly and easily.  It can be helpful to keep track of a log of your progress. Your caregiver can provide you with a simple table to help with this. If you are using the spirometer at home, follow these instructions: [place] IF:   You are having difficultly using the spirometer.  You have trouble using the spirometer as often as instructed.  Your pain medication is not giving enough relief while using the spirometer.  You  develop fever of 100.5 F (38.1 C) or higher. SEEK IMMEDIATE MEDICAL CARE IF:   You cough up bloody sputum that had not been present before.  You develop fever of 102 F (38.9 C) or greater.  You develop worsening pain at or near the incision site. MAKE SURE YOU:   Understand these instructions.  Will watch your condition.  Will get help right away if you are not doing well or get worse. Document Released: 12/20/2006 Document Revised: 11/01/2011 Document Reviewed: 02/20/2007 [facility] Patient Information 2014 [place], [place].   ________________________________________________________________________

## 2019-03-05 ENCOUNTER — Other Ambulatory Visit: Payer: Self-pay

## 2019-03-05 ENCOUNTER — Encounter (HOSPITAL_COMMUNITY)
Admission: RE | Admit: 2019-03-05 | Discharge: 2019-03-05 | Disposition: A | Discharge status: Home / Self Care | Payer: Medicare HMO | Source: Ambulatory Visit | Attending: Orthopedic Surgery | Admitting: Orthopedic Surgery

## 2019-03-05 ENCOUNTER — Encounter (HOSPITAL_COMMUNITY): Payer: Self-pay

## 2019-03-05 DIAGNOSIS — M1712 Unilateral primary osteoarthritis, left knee: Secondary | ICD-10-CM | POA: Insufficient documentation

## 2019-03-05 DIAGNOSIS — I10 Essential (primary) hypertension: Secondary | ICD-10-CM | POA: Insufficient documentation

## 2019-03-05 DIAGNOSIS — Z01818 Encounter for other preprocedural examination: Secondary | ICD-10-CM | POA: Diagnosis not present

## 2019-03-05 LAB — BASIC METABOLIC PANEL
Anion gap: 10 (ref 5–15)
BUN: 21 mg/dL — ABNORMAL HIGH (ref 6–20)
CO2: 27 mmol/L (ref 22–32)
Calcium: 8.8 mg/dL — ABNORMAL LOW (ref 8.9–10.3)
Chloride: 103 mmol/L (ref 98–111)
Creatinine, Ser: 1.19 mg/dL (ref 0.61–1.24)
GFR calc Af Amer: >60 mL/min (ref >60)
GFR calc non Af Amer: >60 mL/min (ref >60)
Glucose, Bld: 81 mg/dL (ref 70–99)
Potassium: 4.7 mmol/L (ref 3.5–5.1)
Sodium: 140 mmol/L (ref 135–145)

## 2019-03-05 LAB — CBC
HCT: 47.3 % (ref 39.0–52.0)
Hemoglobin: 14.9 g/dL (ref 13.0–17.0)
MCH: 29.9 pg (ref 26.0–34.0)
MCHC: 31.5 g/dL (ref 30.0–36.0)
MCV: 95 fL (ref 80.0–100.0)
Platelets: 210 10*3/uL (ref 150–400)
RBC: 4.98 MIL/uL (ref 4.22–5.81)
RDW: 13.1 % (ref 11.5–15.5)
WBC: 5.4 10*3/uL (ref 4.0–10.5)
nRBC: 0 % (ref 0.0–0.2)

## 2019-03-05 LAB — SURGICAL PCR SCREEN
MRSA, PCR: NEGATIVE
Staphylococcus aureus: NEGATIVE

## 2019-03-09 ENCOUNTER — Other Ambulatory Visit (HOSPITAL_COMMUNITY)
Admission: RE | Admit: 2019-03-09 | Discharge: 2019-03-09 | Disposition: A | Discharge status: Home / Self Care | Payer: Medicare HMO | Source: Ambulatory Visit | Attending: Orthopedic Surgery | Admitting: Orthopedic Surgery

## 2019-03-09 DIAGNOSIS — I10 Essential (primary) hypertension: Secondary | ICD-10-CM | POA: Diagnosis not present

## 2019-03-09 DIAGNOSIS — Z1159 Encounter for screening for other viral diseases: Secondary | ICD-10-CM | POA: Insufficient documentation

## 2019-03-09 DIAGNOSIS — F259 Schizoaffective disorder, unspecified: Secondary | ICD-10-CM | POA: Diagnosis not present

## 2019-03-09 DIAGNOSIS — R972 Elevated prostate specific antigen [PSA]: Secondary | ICD-10-CM | POA: Diagnosis not present

## 2019-03-09 DIAGNOSIS — M19012 Primary osteoarthritis, left shoulder: Secondary | ICD-10-CM | POA: Diagnosis not present

## 2019-03-09 DIAGNOSIS — Z6829 Body mass index (BMI) 29.0-29.9, adult: Secondary | ICD-10-CM | POA: Diagnosis not present

## 2019-03-09 LAB — SARS CORONAVIRUS 2 (TAT 6-24 HRS): SARS Coronavirus 2: NEGATIVE

## 2019-03-13 ENCOUNTER — Inpatient Hospital Stay (HOSPITAL_COMMUNITY): Payer: Medicare HMO

## 2019-03-13 ENCOUNTER — Encounter (INDEPENDENT_AMBULATORY_CARE_PROVIDER_SITE_OTHER): Payer: Self-pay

## 2019-03-13 ENCOUNTER — Other Ambulatory Visit: Payer: Self-pay

## 2019-03-13 ENCOUNTER — Inpatient Hospital Stay (HOSPITAL_COMMUNITY)
Admission: RE | Admit: 2019-03-13 | Discharge: 2019-03-15 | DRG: 483 | Disposition: A | Discharge status: Home / Self Care | Payer: Medicare HMO | Attending: Orthopedic Surgery | Admitting: Orthopedic Surgery

## 2019-03-13 ENCOUNTER — Encounter (HOSPITAL_COMMUNITY)
Admission: RE | Disposition: A | Discharge status: Home / Self Care | Payer: Self-pay | Source: Home / Self Care | Attending: Orthopedic Surgery

## 2019-03-13 ENCOUNTER — Inpatient Hospital Stay (HOSPITAL_COMMUNITY): Payer: Medicare HMO | Admitting: Emergency Medicine

## 2019-03-13 ENCOUNTER — Encounter (HOSPITAL_COMMUNITY): Payer: Self-pay | Admitting: *Deleted

## 2019-03-13 ENCOUNTER — Inpatient Hospital Stay (HOSPITAL_COMMUNITY): Payer: Medicare HMO | Admitting: Anesthesiology

## 2019-03-13 DIAGNOSIS — R0902 Hypoxemia: Secondary | ICD-10-CM | POA: Diagnosis not present

## 2019-03-13 DIAGNOSIS — F259 Schizoaffective disorder, unspecified: Secondary | ICD-10-CM | POA: Diagnosis present

## 2019-03-13 DIAGNOSIS — I10 Essential (primary) hypertension: Secondary | ICD-10-CM | POA: Diagnosis not present

## 2019-03-13 DIAGNOSIS — Z471 Aftercare following joint replacement surgery: Secondary | ICD-10-CM | POA: Diagnosis not present

## 2019-03-13 DIAGNOSIS — M25712 Osteophyte, left shoulder: Secondary | ICD-10-CM | POA: Diagnosis present

## 2019-03-13 DIAGNOSIS — T413X5A Adverse effect of local anesthetics, initial encounter: Secondary | ICD-10-CM | POA: Diagnosis not present

## 2019-03-13 DIAGNOSIS — Z96612 Presence of left artificial shoulder joint: Secondary | ICD-10-CM | POA: Diagnosis not present

## 2019-03-13 DIAGNOSIS — Y92230 Patient room in hospital as the place of occurrence of the external cause: Secondary | ICD-10-CM | POA: Diagnosis not present

## 2019-03-13 DIAGNOSIS — Z9049 Acquired absence of other specified parts of digestive tract: Secondary | ICD-10-CM

## 2019-03-13 DIAGNOSIS — M19012 Primary osteoarthritis, left shoulder: Secondary | ICD-10-CM | POA: Diagnosis not present

## 2019-03-13 DIAGNOSIS — R0602 Shortness of breath: Secondary | ICD-10-CM | POA: Diagnosis not present

## 2019-03-13 DIAGNOSIS — G8918 Other acute postprocedural pain: Secondary | ICD-10-CM | POA: Diagnosis not present

## 2019-03-13 HISTORY — PX: TOTAL SHOULDER ARTHROPLASTY: SHX126

## 2019-03-13 HISTORY — DX: Retention of urine, unspecified: R33.9

## 2019-03-13 HISTORY — DX: Primary osteoarthritis, left shoulder: M19.012

## 2019-03-13 SURGERY — ARTHROPLASTY, SHOULDER, TOTAL
Anesthesia: General | Laterality: Left

## 2019-03-13 MED ORDER — ACETAMINOPHEN 325 MG PO TABS: 325.0000 mg | ORAL_TABLET | Freq: Four times a day (QID) | ORAL | Status: DC | PRN

## 2019-03-13 MED ORDER — OXYCODONE HCL 5 MG/5ML PO SOLN: 5.0000 mg | Freq: Once | ORAL | Status: DC | PRN

## 2019-03-13 MED ORDER — ROCURONIUM BROMIDE 100 MG/10ML IV SOLN
INTRAVENOUS | Status: DC | PRN
  Administered 2019-03-13: 70 mg via INTRAVENOUS

## 2019-03-13 MED ORDER — LISINOPRIL 20 MG PO TABS
20.0000 mg | ORAL_TABLET | Freq: Every day | ORAL | Status: DC
  Administered 2019-03-13 – 2019-03-14 (×2): 20 mg via ORAL
  Filled 2019-03-13 (×2): qty 1

## 2019-03-13 MED ORDER — OXYCODONE HCL 5 MG PO TABS: 5.0000 mg | ORAL_TABLET | Freq: Once | ORAL | Status: DC | PRN

## 2019-03-13 MED ORDER — ACETAMINOPHEN 500 MG PO TABS
1000.0000 mg | ORAL_TABLET | Freq: Four times a day (QID) | ORAL | Status: AC
  Administered 2019-03-13 – 2019-03-14 (×4): 1000 mg via ORAL
  Filled 2019-03-13 (×3): qty 2

## 2019-03-13 MED ORDER — BUPIVACAINE HCL (PF) 0.5 % IJ SOLN
INTRAMUSCULAR | Status: DC | PRN
  Administered 2019-03-13: 20 mL via PERINEURAL

## 2019-03-13 MED ORDER — MAGNESIUM CITRATE PO SOLN: 1.0000 | Freq: Once | ORAL | Status: DC | PRN

## 2019-03-13 MED ORDER — SENNA-DOCUSATE SODIUM 8.6-50 MG PO TABS: 2.0000 | ORAL_TABLET | Freq: Every day | ORAL | 1 refills | Status: DC

## 2019-03-13 MED ORDER — ACETAMINOPHEN 500 MG PO TABS
1000.0000 mg | ORAL_TABLET | Freq: Once | ORAL | Status: AC
  Administered 2019-03-13: 08:00:00 1000 mg via ORAL
  Filled 2019-03-13: qty 2

## 2019-03-13 MED ORDER — LIDOCAINE HCL (PF) 1 % IJ SOLN
INTRAMUSCULAR | Status: AC
  Filled 2019-03-13: qty 30

## 2019-03-13 MED ORDER — ALBUMIN HUMAN 5 % IV SOLN
INTRAVENOUS | Status: DC | PRN
  Administered 2019-03-13: 12:00:00 via INTRAVENOUS

## 2019-03-13 MED ORDER — METOCLOPRAMIDE HCL 5 MG PO TABS: 5.0000 mg | ORAL_TABLET | Freq: Three times a day (TID) | ORAL | Status: DC | PRN

## 2019-03-13 MED ORDER — BACLOFEN 10 MG PO TABS: 10.0000 mg | ORAL_TABLET | Freq: Three times a day (TID) | ORAL | 0 refills | Status: DC

## 2019-03-13 MED ORDER — PROPOFOL 10 MG/ML IV BOLUS
INTRAVENOUS | Status: DC | PRN
  Administered 2019-03-13: 180 mg via INTRAVENOUS

## 2019-03-13 MED ORDER — ONDANSETRON HCL 4 MG PO TABS: 4.0000 mg | ORAL_TABLET | Freq: Three times a day (TID) | ORAL | 0 refills | Status: DC | PRN

## 2019-03-13 MED ORDER — HYDROMORPHONE HCL 1 MG/ML IJ SOLN: 0.5000 mg | INTRAMUSCULAR | Status: DC | PRN

## 2019-03-13 MED ORDER — MENTHOL 3 MG MT LOZG: 1.0000 | LOZENGE | OROMUCOSAL | Status: DC | PRN

## 2019-03-13 MED ORDER — ALBUMIN HUMAN 5 % IV SOLN
INTRAVENOUS | Status: AC
  Filled 2019-03-13: qty 250

## 2019-03-13 MED ORDER — PROMETHAZINE HCL 25 MG/ML IJ SOLN: 6.2500 mg | INTRAMUSCULAR | Status: DC | PRN

## 2019-03-13 MED ORDER — METHOCARBAMOL 500 MG PO TABS
500.0000 mg | ORAL_TABLET | Freq: Four times a day (QID) | ORAL | Status: DC | PRN
  Administered 2019-03-13 – 2019-03-14 (×3): 500 mg via ORAL
  Filled 2019-03-13 (×3): qty 1

## 2019-03-13 MED ORDER — DOCUSATE SODIUM 100 MG PO CAPS
100.0000 mg | ORAL_CAPSULE | Freq: Two times a day (BID) | ORAL | Status: DC
  Administered 2019-03-13 – 2019-03-15 (×3): 100 mg via ORAL
  Filled 2019-03-13 (×4): qty 1

## 2019-03-13 MED ORDER — LIDOCAINE HCL (CARDIAC) PF 100 MG/5ML IV SOSY
PREFILLED_SYRINGE | INTRAVENOUS | Status: DC | PRN
  Administered 2019-03-13: 30 mg via INTRAVENOUS

## 2019-03-13 MED ORDER — SODIUM CHLORIDE 0.9 % IV SOLN
INTRAVENOUS | Status: DC | PRN
  Administered 2019-03-13: 11:00:00 80 ug via INTRAVENOUS
  Administered 2019-03-13: 11:00:00 40 ug via INTRAVENOUS

## 2019-03-13 MED ORDER — FENTANYL CITRATE (PF) 100 MCG/2ML IJ SOLN
INTRAMUSCULAR | Status: DC | PRN
  Administered 2019-03-13: 50 ug via INTRAVENOUS
  Administered 2019-03-13: 25 ug via INTRAVENOUS

## 2019-03-13 MED ORDER — SUGAMMADEX SODIUM 200 MG/2ML IV SOLN
INTRAVENOUS | Status: DC | PRN
  Administered 2019-03-13: 200 mg via INTRAVENOUS

## 2019-03-13 MED ORDER — EPHEDRINE 5 MG/ML INJ
INTRAVENOUS | Status: AC
  Filled 2019-03-13: qty 10

## 2019-03-13 MED ORDER — OXYCODONE HCL 5 MG PO TABS: 10.0000 mg | ORAL_TABLET | ORAL | Status: DC | PRN

## 2019-03-13 MED ORDER — ONDANSETRON HCL 4 MG/2ML IJ SOLN
INTRAMUSCULAR | Status: AC
  Filled 2019-03-13: qty 2

## 2019-03-13 MED ORDER — ARTIFICIAL TEARS OPHTHALMIC OINT
TOPICAL_OINTMENT | OPHTHALMIC | Status: AC
  Filled 2019-03-13: qty 3.5

## 2019-03-13 MED ORDER — METHOCARBAMOL 500 MG IVPB - SIMPLE MED
500.0000 mg | Freq: Four times a day (QID) | INTRAVENOUS | Status: DC | PRN
  Filled 2019-03-13: qty 50

## 2019-03-13 MED ORDER — BUPIVACAINE LIPOSOME 1.3 % IJ SUSP
INTRAMUSCULAR | Status: DC | PRN
  Administered 2019-03-13: 10 mL via PERINEURAL

## 2019-03-13 MED ORDER — DIPHENHYDRAMINE HCL 12.5 MG/5ML PO ELIX: 12.5000 mg | ORAL_SOLUTION | ORAL | Status: DC | PRN

## 2019-03-13 MED ORDER — VASOPRESSIN 20 UNIT/ML IV SOLN
INTRAVENOUS | Status: DC | PRN
  Administered 2019-03-13 (×2): 2 [IU] via INTRAVENOUS
  Administered 2019-03-13 (×2): 3 [IU] via INTRAVENOUS
  Administered 2019-03-13: 2 [IU] via INTRAVENOUS
  Administered 2019-03-13: 1 [IU] via INTRAVENOUS
  Administered 2019-03-13: 2 [IU] via INTRAVENOUS

## 2019-03-13 MED ORDER — ONDANSETRON HCL 4 MG PO TABS: 4.0000 mg | ORAL_TABLET | Freq: Four times a day (QID) | ORAL | Status: DC | PRN

## 2019-03-13 MED ORDER — QUETIAPINE FUMARATE 300 MG PO TABS
800.0000 mg | ORAL_TABLET | Freq: Every day | ORAL | Status: DC
  Administered 2019-03-13 – 2019-03-14 (×2): 800 mg via ORAL
  Filled 2019-03-13 (×2): qty 1

## 2019-03-13 MED ORDER — HYDROMORPHONE HCL 1 MG/ML IJ SOLN: 0.2500 mg | INTRAMUSCULAR | Status: DC | PRN

## 2019-03-13 MED ORDER — ONDANSETRON HCL 4 MG/2ML IJ SOLN: 4.0000 mg | Freq: Four times a day (QID) | INTRAMUSCULAR | Status: DC | PRN

## 2019-03-13 MED ORDER — VASOPRESSIN 20 UNIT/ML IV SOLN
INTRAVENOUS | Status: AC
  Filled 2019-03-13: qty 1

## 2019-03-13 MED ORDER — SODIUM CHLORIDE 0.9 % IV SOLN
INTRAVENOUS | Status: DC | PRN
  Administered 2019-03-13: 11:00:00 10 ug/min via INTRAVENOUS

## 2019-03-13 MED ORDER — PHENYLEPHRINE HCL (PRESSORS) 10 MG/ML IV SOLN
INTRAVENOUS | Status: AC
  Filled 2019-03-13: qty 1

## 2019-03-13 MED ORDER — POLYETHYLENE GLYCOL 3350 17 G PO PACK: 17.0000 g | PACK | Freq: Every day | ORAL | Status: DC | PRN

## 2019-03-13 MED ORDER — METOCLOPRAMIDE HCL 5 MG/ML IJ SOLN: 5.0000 mg | Freq: Three times a day (TID) | INTRAMUSCULAR | Status: DC | PRN

## 2019-03-13 MED ORDER — DEXAMETHASONE SODIUM PHOSPHATE 10 MG/ML IJ SOLN
INTRAMUSCULAR | Status: DC | PRN
  Administered 2019-03-13: 10 mg via INTRAVENOUS

## 2019-03-13 MED ORDER — ALUM & MAG HYDROXIDE-SIMETH 200-200-20 MG/5ML PO SUSP: 30.0000 mL | ORAL | Status: DC | PRN

## 2019-03-13 MED ORDER — TRANEXAMIC ACID-NACL 1000-0.7 MG/100ML-% IV SOLN
1000.0000 mg | Freq: Once | INTRAVENOUS | Status: AC
  Administered 2019-03-13: 1000 mg via INTRAVENOUS
  Filled 2019-03-13: qty 100

## 2019-03-13 MED ORDER — OXYCODONE HCL 5 MG PO TABS
5.0000 mg | ORAL_TABLET | ORAL | Status: DC | PRN
  Administered 2019-03-14 – 2019-03-15 (×6): 5 mg via ORAL
  Filled 2019-03-13 (×6): qty 1

## 2019-03-13 MED ORDER — CHLORHEXIDINE GLUCONATE 4 % EX LIQD: 60.0000 mL | Freq: Once | CUTANEOUS | Status: DC

## 2019-03-13 MED ORDER — POTASSIUM CHLORIDE IN NACL 20-0.45 MEQ/L-% IV SOLN
INTRAVENOUS | Status: DC
  Administered 2019-03-13: 15:00:00 via INTRAVENOUS
  Filled 2019-03-13 (×2): qty 1000

## 2019-03-13 MED ORDER — DIVALPROEX SODIUM 250 MG PO DR TAB
1000.0000 mg | DELAYED_RELEASE_TABLET | Freq: Every day | ORAL | Status: DC
  Administered 2019-03-13 – 2019-03-14 (×2): 1000 mg via ORAL
  Filled 2019-03-13 (×2): qty 4

## 2019-03-13 MED ORDER — MIDAZOLAM HCL 2 MG/2ML IJ SOLN
1.0000 mg | INTRAMUSCULAR | Status: DC
  Administered 2019-03-13: 09:00:00 2 mg via INTRAVENOUS
  Filled 2019-03-13: qty 2

## 2019-03-13 MED ORDER — CEFAZOLIN SODIUM-DEXTROSE 2-4 GM/100ML-% IV SOLN
2.0000 g | INTRAVENOUS | Status: AC
  Administered 2019-03-13: 2 g via INTRAVENOUS
  Filled 2019-03-13: qty 100

## 2019-03-13 MED ORDER — PROPOFOL 10 MG/ML IV BOLUS
INTRAVENOUS | Status: AC
  Filled 2019-03-13: qty 20

## 2019-03-13 MED ORDER — CEFAZOLIN SODIUM-DEXTROSE 1-4 GM/50ML-% IV SOLN
1.0000 g | Freq: Four times a day (QID) | INTRAVENOUS | Status: AC
  Administered 2019-03-13 – 2019-03-14 (×3): 1 g via INTRAVENOUS
  Filled 2019-03-13 (×3): qty 50

## 2019-03-13 MED ORDER — MIDAZOLAM HCL 5 MG/5ML IJ SOLN
INTRAMUSCULAR | Status: DC | PRN
  Administered 2019-03-13 (×2): 1 mg via INTRAVENOUS

## 2019-03-13 MED ORDER — SODIUM CHLORIDE 0.9 % IR SOLN
Status: DC | PRN
  Administered 2019-03-13 (×2): 1000 mL

## 2019-03-13 MED ORDER — TRANEXAMIC ACID-NACL 1000-0.7 MG/100ML-% IV SOLN
1000.0000 mg | INTRAVENOUS | Status: AC
  Administered 2019-03-13: 11:00:00 1000 mg via INTRAVENOUS
  Filled 2019-03-13: qty 100

## 2019-03-13 MED ORDER — MEPERIDINE HCL 50 MG/ML IJ SOLN: 6.2500 mg | INTRAMUSCULAR | Status: DC | PRN

## 2019-03-13 MED ORDER — FENTANYL CITRATE (PF) 250 MCG/5ML IJ SOLN
INTRAMUSCULAR | Status: AC
  Filled 2019-03-13: qty 5

## 2019-03-13 MED ORDER — PHENYLEPHRINE 40 MCG/ML (10ML) SYRINGE FOR IV PUSH (FOR BLOOD PRESSURE SUPPORT)
PREFILLED_SYRINGE | INTRAVENOUS | Status: AC
  Filled 2019-03-13: qty 10

## 2019-03-13 MED ORDER — METOPROLOL SUCCINATE ER 50 MG PO TB24
50.0000 mg | ORAL_TABLET | Freq: Every day | ORAL | Status: DC
  Administered 2019-03-13 – 2019-03-14 (×2): 50 mg via ORAL
  Filled 2019-03-13 (×2): qty 1

## 2019-03-13 MED ORDER — MIDAZOLAM HCL 2 MG/2ML IJ SOLN
INTRAMUSCULAR | Status: AC
  Filled 2019-03-13: qty 2

## 2019-03-13 MED ORDER — EPHEDRINE SULFATE-NACL 50-0.9 MG/10ML-% IV SOSY
PREFILLED_SYRINGE | INTRAVENOUS | Status: DC | PRN
  Administered 2019-03-13 (×4): 10 mg via INTRAVENOUS

## 2019-03-13 MED ORDER — OXYCODONE HCL 5 MG PO TABS: 5.0000 mg | ORAL_TABLET | ORAL | 0 refills | Status: DC | PRN

## 2019-03-13 MED ORDER — LACTATED RINGERS IV SOLN
INTRAVENOUS | Status: DC
  Administered 2019-03-13 (×2): via INTRAVENOUS

## 2019-03-13 MED ORDER — FENTANYL CITRATE (PF) 100 MCG/2ML IJ SOLN
50.0000 ug | INTRAMUSCULAR | Status: DC
  Administered 2019-03-13: 09:00:00 100 ug via INTRAVENOUS
  Filled 2019-03-13: qty 2

## 2019-03-13 MED ORDER — ZOLPIDEM TARTRATE 5 MG PO TABS: 5.0000 mg | ORAL_TABLET | Freq: Every evening | ORAL | Status: DC | PRN

## 2019-03-13 MED ORDER — ROCURONIUM BROMIDE 10 MG/ML (PF) SYRINGE
PREFILLED_SYRINGE | INTRAVENOUS | Status: AC
  Filled 2019-03-13: qty 10

## 2019-03-13 MED ORDER — ONDANSETRON HCL 4 MG/2ML IJ SOLN
INTRAMUSCULAR | Status: DC | PRN
  Administered 2019-03-13: 4 mg via INTRAVENOUS

## 2019-03-13 MED ORDER — BISACODYL 10 MG RE SUPP: 10.0000 mg | Freq: Every day | RECTAL | Status: DC | PRN

## 2019-03-13 MED ORDER — DEXAMETHASONE SODIUM PHOSPHATE 10 MG/ML IJ SOLN
INTRAMUSCULAR | Status: AC
  Filled 2019-03-13: qty 1

## 2019-03-13 MED ORDER — PHENOL 1.4 % MT LIQD: 1.0000 | OROMUCOSAL | Status: DC | PRN

## 2019-03-13 SURGICAL SUPPLY — 62 items
ADPR HD STD TPR HUM TI RVRS (Orthopedic Implant) ×1 IMPLANT
AID PSTN UNV HD RSTRNT DISP (MISCELLANEOUS)
BAG ZIPLOCK 12X15 (MISCELLANEOUS) ×3 IMPLANT
BLADE SAW SGTL 18X1.27X75 (BLADE) ×2 IMPLANT
BLADE SAW SGTL 18X1.27X75MM (BLADE) ×1
CEMENT BONE R 1X40 (Cement) ×3 IMPLANT
CLOSURE STERI-STRIP 1/2X4 (GAUZE/BANDAGES/DRESSINGS) ×1
CLSR STERI-STRIP ANTIMIC 1/2X4 (GAUZE/BANDAGES/DRESSINGS) ×2 IMPLANT
COVER BACK TABLE 60X90IN (DRAPES) ×3 IMPLANT
COVER MAYO STAND STRL (DRAPES) ×3 IMPLANT
COVER SURGICAL LIGHT HANDLE (MISCELLANEOUS) ×3 IMPLANT
COVER WAND RF STERILE (DRAPES) IMPLANT
DECANTER SPIKE VIAL GLASS SM (MISCELLANEOUS) IMPLANT
DRAPE POUCH INSTRU U-SHP 10X18 (DRAPES) ×3 IMPLANT
DRAPE SHEET LG 3/4 BI-LAMINATE (DRAPES) ×3 IMPLANT
DRAPE SURG 17X11 SM STRL (DRAPES) ×3 IMPLANT
DRAPE U-SHAPE 47X51 STRL (DRAPES) ×3 IMPLANT
DRSG MEPILEX BORDER 4X8 (GAUZE/BANDAGES/DRESSINGS) ×3 IMPLANT
DURAPREP 26ML APPLICATOR (WOUND CARE) ×3 IMPLANT
ELECT REM PT RETURN 15FT ADLT (MISCELLANEOUS) ×1 IMPLANT
GLENOID PEGGED STRL MED 4MM (Orthopedic Implant) ×2 IMPLANT
GLENOID POST REGENREX HYBRID (Orthopedic Implant) ×2 IMPLANT
GLOVE BIO SURGEON STRL SZ7.5 (GLOVE) ×5 IMPLANT
GLOVE BIO SURGEON STRL SZ8 (GLOVE) ×3 IMPLANT
GLOVE BIOGEL PI IND STRL 8 (GLOVE) ×2 IMPLANT
GLOVE BIOGEL PI INDICATOR 8 (GLOVE) ×4
GOWN STRL REUS W/TWL 2XL LVL3 (GOWN DISPOSABLE) ×3 IMPLANT
GOWN STRL REUS W/TWL LRG LVL3 (GOWN DISPOSABLE) ×3 IMPLANT
HANDPIECE INTERPULSE COAX TIP (DISPOSABLE) ×3
HEAD HUMERAL BIPOLAR 42X18X46 (Orthopedic Implant) IMPLANT
HEAD HUMERAL COMP STD (Orthopedic Implant) IMPLANT
HOOD PEEL AWAY FLYTE STAYCOOL (MISCELLANEOUS) ×11 IMPLANT
HUMERAL HEAD BIPOLAR 42X18X46 (Orthopedic Implant) ×3 IMPLANT
HUMERAL HEAD COMP STD (Orthopedic Implant) ×3 IMPLANT
KIT BASIN OR (CUSTOM PROCEDURE TRAY) ×3 IMPLANT
KIT TURNOVER KIT A (KITS) IMPLANT
NS IRRIG 1000ML POUR BTL (IV SOLUTION) ×3 IMPLANT
PACK SHOULDER (CUSTOM PROCEDURE TRAY) ×3 IMPLANT
PAD COLD SHLDR WRAP-ON (PAD) ×2 IMPLANT
PIN HUMERAL STMN 3.2MMX9IN (INSTRUMENTS) ×2 IMPLANT
PIN STEINMANN THREADED TIP (PIN) ×2 IMPLANT
PROTECTOR NERVE ULNAR (MISCELLANEOUS) ×3 IMPLANT
RESTRAINT HEAD UNIVERSAL NS (MISCELLANEOUS) IMPLANT
SET HNDPC FAN SPRY TIP SCT (DISPOSABLE) ×1 IMPLANT
SLING ARM IMMOBILIZER LRG (SOFTGOODS) ×3 IMPLANT
SMARTMIX MINI TOWER (MISCELLANEOUS)
SPONGE LAP 18X18 RF (DISPOSABLE) ×2 IMPLANT
STEM HUMERAL STRL 13MMX55MM (Stem) ×2 IMPLANT
SUCTION FRAZIER HANDLE 12FR (TUBING) ×2
SUCTION TUBE FRAZIER 12FR DISP (TUBING) ×1 IMPLANT
SUPPORT WRAP ARM LG (MISCELLANEOUS) ×3 IMPLANT
SUT FIBERWIRE #2 38 REV NDL BL (SUTURE) ×21
SUT VIC AB 1 CT1 36 (SUTURE) ×3 IMPLANT
SUT VIC AB 2-0 CT1 27 (SUTURE) ×3
SUT VIC AB 2-0 CT1 TAPERPNT 27 (SUTURE) ×1 IMPLANT
SUT VIC AB 3-0 SH 8-18 (SUTURE) ×3 IMPLANT
SUTURE FIBERWR#2 38 REV NDL BL (SUTURE) ×4 IMPLANT
TOWEL OR 17X26 10 PK STRL BLUE (TOWEL DISPOSABLE) ×3 IMPLANT
TOWEL OR NON WOVEN STRL DISP B (DISPOSABLE) ×3 IMPLANT
TOWER SMARTMIX MINI (MISCELLANEOUS) IMPLANT
WATER STERILE IRR 1000ML POUR (IV SOLUTION) ×6 IMPLANT
YANKAUER SUCT BULB TIP 10FT TU (MISCELLANEOUS) ×3 IMPLANT

## 2019-03-13 NOTE — Anesthesia Procedure Notes (Signed)
Procedure Name: Intubation Date/Time: 03/13/2019 10:31 AM Performed by: Garrel Ridgel, CRNA Pre-anesthesia Checklist: Patient identified, Emergency Drugs available, Suction available, Patient being monitored and Timeout performed Patient Re-evaluated:Patient Re-evaluated prior to induction Oxygen Delivery Method: Circle system utilized Preoxygenation: Pre-oxygenation with 100% oxygen Induction Type: IV induction Ventilation: Mask ventilation without difficulty Laryngoscope Size: Mac and 3 Grade View: Grade II Tube type: Oral Tube size: 7.5 mm Number of attempts: 1 Airway Equipment and Method: Stylet Placement Confirmation: ETT inserted through vocal cords under direct vision,  positive ETCO2 and breath sounds checked- equal and bilateral Secured at: 23 cm Tube secured with: Tape Dental Injury: Teeth and Oropharynx as per pre-operative assessment

## 2019-03-13 NOTE — Anesthesia Postprocedure Evaluation (Signed)
Anesthesia Post Note  Patient: Steve Levy  Procedure(s) Performed: TOTAL SHOULDER ARTHROPLASTY (Left )     Patient location during evaluation: PACU Anesthesia Type: General Level of consciousness: awake and alert Pain management: pain level controlled Vital Signs Assessment: post-procedure vital signs reviewed and stable Respiratory status: spontaneous breathing, nonlabored ventilation and respiratory function stable Cardiovascular status: blood pressure returned to baseline and stable Postop Assessment: no apparent nausea or vomiting Anesthetic complications: no    Last Vitals:  Vitals:   03/13/19 1415 03/13/19 1439  BP: 123/85 128/84  Pulse: 81 83  Resp: 15 16  Temp: 36.4 C (!) 36.4 C  SpO2: 99% 100%    Last Pain:  Vitals:   03/13/19 1439  TempSrc: Oral  PainSc:                  Lynda Rainwater

## 2019-03-13 NOTE — Progress Notes (Signed)
Assisted Dr. Miller with left, ultrasound guided, interscalene  block. Side rails up, monitors on throughout procedure. See vital signs in flow sheet. Tolerated Procedure well.  

## 2019-03-13 NOTE — H&P (Signed)
PREOPERATIVE H&P  Chief Complaint: Left shoulder pain  HPI: Steve Levy is a 58 y.o. male who presents for preoperative history and physical with a diagnosis of right shoulder primary localized osteoarthritis. Symptoms are rated as moderate to severe, and have been worsening.  This is significantly impairing activities of daily living.  He has elected for surgical management.  He is an avid Product manager, and has had difficulty with function.  He has failed injections, activity modification, anti-inflammatories, and assistive devices.  Preoperative X-rays demonstrate end stage degenerative changes with osteophyte formation, loss of joint space, subchondral sclerosis.   Past Medical History:  Diagnosis Date  . Chronic schizoaffective disorder ([place])   . Hypertension   . Pancreatic pseudocyst 2014  . Renal cyst    Past Surgical History:  Procedure Laterality Date  . CHOLECYSTECTOMY N/A 12/25/2012   Procedure: LAPAROSCOPIC CHOLECYSTECTOMY WITH INTRAOPERATIVE CHOLANGIOGRAM;  Surgeon: Edward Jolly, MD;  Location: [facility];  Service: General;  Laterality: N/A;  . COLONOSCOPY    . EYE SURGERY    . LASIK  2000   Social History   Socioeconomic History  . Marital status: Married    Spouse name: Not on file  . Number of children: Not on file  . Years of education: Not on file  . Highest education level: Not on file  Occupational History  . Not on file  Social Needs  . Financial resource strain: Not on file  . Food insecurity    Worry: Not on file    Inability: Not on file  . Transportation needs    Medical: Not on file    Non-medical: Not on file  Tobacco Use  . Smoking status: Never Smoker  . Smokeless tobacco: Current User    Types: Chew  Substance and Sexual Activity  . Alcohol use: Yes    Alcohol/week: 1.0 standard drinks    Types: 1 Cans of beer per week    Comment: occasional  . Drug use: No  . Sexual activity: Yes  Lifestyle  . Physical activity    Days per  week: Not on file    Minutes per session: Not on file  . Stress: Not on file  Relationships  . Social Herbalist on phone: Not on file    Gets together: Not on file    Attends religious service: Not on file    Active member of club or organization: Not on file    Attends meetings of clubs or organizations: Not on file    Relationship status: Not on file  Other Topics Concern  . Not on file  Social History Narrative  . Not on file   History reviewed. No pertinent family history. No Known Allergies Prior to Admission medications   Medication Sig Start Date End Date Taking? Authorizing Provider  divalproex (DEPAKOTE) 500 MG DR tablet Take 1,000 mg by mouth at bedtime. 01/22/19  Yes [provider]  lisinopril (ZESTRIL) 20 MG tablet Take 20 mg by mouth at bedtime.    Yes [provider]  metoprolol succinate (TOPROL-XL) 50 MG 24 hr tablet Take 50 mg by mouth at bedtime. 01/20/19  Yes [provider]  QUEtiapine (SEROQUEL) 400 MG tablet Take 800 mg by mouth at bedtime.    Yes [provider]     Positive ROS: All other systems have been reviewed and were otherwise negative with the exception of those mentioned in the HPI and as above.  Physical Exam: General: Alert,  no acute distress Cardiovascular: No pedal edema Respiratory: No cyanosis, no use of accessory musculature GI: No organomegaly, abdomen is soft and non-tender Skin: No lesions in the area of chief complaint Neurologic: Sensation intact distally Psychiatric: Patient is competent for consent with normal mood and affect Lymphatic: No axillary or cervical lymphadenopathy  MUSCULOSKELETAL: Left shoulder active motion 0 to 90 degrees of external rotation 5 degrees with crepitance, and stiffness, with a painful arc of motion.  Assessment: Left shoulder primary localized osteoarthritis   Plan: Plan for Procedure(s): TOTAL SHOULDER ARTHROPLASTY  The risks benefits and  alternatives were discussed with the patient including but not limited to the risks of nonoperative treatment, versus surgical intervention including infection, bleeding, nerve injury,  blood clots, cardiopulmonary complications, morbidity, mortality, among others, and they were willing to proceed.   Anticipated LOS equal to or greater than 2 midnights due to - Age 25 and older with one or more of the following:  - Obesity  - Expected need for hospital services (PT, OT, Nursing) required for safe  discharge  - Anticipated need for postoperative skilled nursing care or inpatient rehab  - Active co-morbidities: None   Jamarian Bridge, MD Cell [phone]   03/13/2019 10:08 AM

## 2019-03-13 NOTE — Discharge Instructions (Signed)

## 2019-03-13 NOTE — Transfer of Care (Signed)
Immediate Anesthesia Transfer of Care Note  Patient: Steve Levy  Procedure(s) Performed: TOTAL SHOULDER ARTHROPLASTY (Left )  Patient Location: PACU  Anesthesia Type:General  Level of Consciousness: awake, oriented, drowsy and patient cooperative  Airway & Oxygen Therapy: Patient Spontanous Breathing and Patient connected to face mask oxygen  Post-op Assessment: Report given to RN and Post -op Vital signs reviewed and stable  Post vital signs: Reviewed and stable  Last Vitals:  Vitals Value Taken Time  BP 106/68 03/13/19 1310  Temp    Pulse 82 03/13/19 1314  Resp 12 03/13/19 1314  SpO2 97 % 03/13/19 1314  Vitals shown include unvalidated device data.  Last Pain:  Vitals:   03/13/19 0748  TempSrc: Oral      Patients Stated Pain Goal: 4 (55/21/74 7159)  Complications: No apparent anesthesia complications

## 2019-03-13 NOTE — Anesthesia Procedure Notes (Signed)
Anesthesia Regional Block: Interscalene brachial plexus block   Pre-Anesthetic Checklist: ,, timeout performed, Correct Patient, Correct Site, Correct Laterality, Correct Procedure, Correct Position, site marked, Risks and benefits discussed,  Surgical consent,  Pre-op evaluation,  At surgeon's request and post-op pain management  Laterality: Left  Prep: chloraprep       Needles:  Injection technique: Single-shot  Needle Type: Stimiplex     Needle Length: 9cm  Needle Gauge: 21     Additional Needles:   Procedures:,,,, ultrasound used (permanent image in chart),,,,  Narrative:  Start time: 03/13/2019 8:57 AM End time: 03/13/2019 9:02 AM Injection made incrementally with aspirations every 5 mL.  Performed by: Personally  Anesthesiologist: Lynda Rainwater, MD

## 2019-03-13 NOTE — Anesthesia Preprocedure Evaluation (Signed)
Anesthesia Evaluation  Patient identified by MRN, date of birth, ID band Patient awake    Reviewed: Allergy & Precautions, H&P , NPO status , Patient's Chart, lab work & pertinent test results  Airway Mallampati: II  TM Distance: >3 FB Neck ROM: Full    Dental no notable dental hx.    Pulmonary neg pulmonary ROS,    Pulmonary exam normal breath sounds clear to auscultation       Cardiovascular hypertension, Pt. on medications negative cardio ROS Normal cardiovascular exam Rhythm:Regular Rate:Normal     Neuro/Psych PSYCHIATRIC DISORDERS ( Chronic schizoaffective disorder) Schizophrenia negative neurological ROS  negative psych ROS   GI/Hepatic negative GI ROS, Neg liver ROS,   Endo/Other  negative endocrine ROS  Renal/GU negative Renal ROS  negative genitourinary   Musculoskeletal negative musculoskeletal ROS (+)   Abdominal   Peds negative pediatric ROS (+)  Hematology negative hematology ROS (+)   Anesthesia Other Findings Schizoaffective disorder  Reproductive/Obstetrics negative OB ROS                             Anesthesia Physical  Anesthesia Plan  ASA: II  Anesthesia Plan: General   Post-op Pain Management:  Regional for Post-op pain   Induction: Intravenous  PONV Risk Score and Plan: 2 and Ondansetron, Midazolam and Treatment may vary due to age or medical condition  Airway Management Planned: Oral ETT  Additional Equipment:   Intra-op Plan:   Post-operative Plan: Extubation in OR  Informed Consent: I have reviewed the patients History and Physical, chart, labs and discussed the procedure including the risks, benefits and alternatives for the proposed anesthesia with the patient or authorized representative who has indicated his/her understanding and acceptance.     Dental advisory given  Plan Discussed with: CRNA  Anesthesia Plan Comments:          Anesthesia Quick Evaluation

## 2019-03-13 NOTE — Op Note (Signed)
03/13/2019  12:37 PM  PATIENT:  Steve Levy    PRE-OPERATIVE DIAGNOSIS: Left shoulder primary localized osteoarthritis  POST-OPERATIVE DIAGNOSIS:  Same  PROCEDURE: LEFT TOTAL SHOULDER ARTHROPLASTY  SURGEON:  Anthon Bridge, MD  PHYSICIAN ASSISTANT: Joya Gaskins, OPA-C, present and scrubbed throughout the case, critical for completion in a timely fashion, and for retraction, instrumentation, and closure.  ANESTHESIA:   General with Exparel interscalene block  PREOPERATIVE INDICATIONS:  [name] is a  58 y.o. male with a diagnosis of djd left shoulder who failed conservative measures and elected for surgical management.    The risks benefits and alternatives were discussed with the patient preoperatively including but not limited to the risks of infection, bleeding, nerve injury, cardiopulmonary complications, subscapularis rupture, persistent stiffness, the need for revision surgery, among others, and the patient was willing to proceed.  ESTIMATED BLOOD LOSS: 150 mL  OPERATIVE IMPLANTS: Biomet comprehensive micro-humeral stem size 13 with a 42 mm +18 mm humeral head set on B directed posteriorly, with a medium glenoid with a central regenerex post.  OPERATIVE FINDINGS: Advanced severe osteoarthritis with significant hypertrophic osteophyte formation both on the glenoid posteriorly as well as the humeral head anteriorly and inferiorly.  Bone quality was good.  He had more posterior wear than anterior wear, and I reamed down the front side slightly.  Unique aspects of the case: Access was reasonably good, despite his muscular build.  The cephalic vein was protected and intact at the completion of the case.  I reamed down more anteriorly, matching just posteriorly, I had all 4 holes contained within the vault, the central hole bled a fair amount after I removed the pin, I think this was cancellus bleeding, but this was the most significant bleeding during the entirety of the  operation.  At the completion of the case the humeral head had significant translation, I felt that I did restore his appropriate bone, and he did not need a larger head, translated just to 50% posteriorly, hopefully this will optimize postoperative motion.  I was trying to strike the balance between stability and mobility, once the subscapularis was repaired it seemed to centralize the head nicely.  The subscapularis repair was overall good, the tendon diminished distally, but I had good bone tunnels to repair the tendon down to.  OPERATIVE PROCEDURE: The patient was brought to the operating room and placed in the supine position. General anesthesia was administered. IV antibiotics were given.  The upper extremity was prepped and draped in usual sterile fashion. The patient was in a beachchair position with all bony prominences padded.   Time out was performed and a deltopectoral approach was carried out. The biceps tendon was tenodesed to the pectoralis tendon. The subscapularis was released, tagging it with a #2 FiberWire, leaving a cuff of tendon for repair.   The inferior osteophyte was removed, and release of the capsule off of the humeral side was completed. The head was dislocated, and I reamed sequentially. I placed the humeral cutting guide at 30 of retroversion, and then pinned this into place, and made my humeral neck cut. This was at the appropriate level.   I then placed deep retractors and exposed the glenoid. I excised the labrum circumferentially, taking care to protect the axillary nerve inferiorly.   I then placed a guidewire into the center position, controlling appropriate version and inclination. I then reamed over the guidewire with the small reamer, and was satisfied with the preparation. I preserved the subchondral  bone in order to maximize the strength and minimize the risk for subsequent subsidence.   I then drilled the central hole for the regenerex peg, and then placed  the guide, and then drilled the 3 peripheral peg holes. I had excellent bony circumferential contact.   I then cleaned the glenoid, irrigated it copiously, and then dried it and cemented the prosthesis into place. Excellent seating was achieved. I had full exposure. The cement cured while I turned my attention to the humeral side.   I sequentially broached, up to the selected size, with the broach set at 30 of retroversion. I placed 3 #2 Fiberwire through the bone for subsequent repair.  I then placed the real stem. I trialed with multiple heads, and the above-named component was selected. Increased posterior coverage improved the coverage. The soft tissue tension was appropriate.   I then impacted the real humeral head into place, reduced the head, and irrigated copiously. Excellent stability and range of motion was achieved. I repaired the subscapularis with a total of 5 #2 FiberWire; one for the interval, one for the corner, and then the remaining three from the lesser tuberosity which had already been passed.  Excellent repair achieved and I irrigated copiously once more. The subcutaneous tissue was closed with Vicryl including the deltopectoral fascia.   The skin was closed with Steri-Strips and sterile gauze was applied. He had a preoperative nerve block. He tolerated the procedure well and there were no complications.

## 2019-03-14 ENCOUNTER — Inpatient Hospital Stay (HOSPITAL_COMMUNITY): Payer: Medicare HMO

## 2019-03-14 ENCOUNTER — Encounter (HOSPITAL_COMMUNITY): Payer: Self-pay | Admitting: Orthopedic Surgery

## 2019-03-14 LAB — CBC
HCT: 41.4 % (ref 39.0–52.0)
Hemoglobin: 13.5 g/dL (ref 13.0–17.0)
MCH: 30.8 pg (ref 26.0–34.0)
MCHC: 32.6 g/dL (ref 30.0–36.0)
MCV: 94.3 fL (ref 80.0–100.0)
Platelets: 156 10*3/uL (ref 150–400)
RBC: 4.39 MIL/uL (ref 4.22–5.81)
RDW: 13.1 % (ref 11.5–15.5)
WBC: 11.8 10*3/uL — ABNORMAL HIGH (ref 4.0–10.5)
nRBC: 0 % (ref 0.0–0.2)

## 2019-03-14 LAB — BASIC METABOLIC PANEL
Anion gap: 10 (ref 5–15)
BUN: 17 mg/dL (ref 6–20)
CO2: 25 mmol/L (ref 22–32)
Calcium: 8.2 mg/dL — ABNORMAL LOW (ref 8.9–10.3)
Chloride: 100 mmol/L (ref 98–111)
Creatinine, Ser: 1.06 mg/dL (ref 0.61–1.24)
GFR calc Af Amer: >60 mL/min (ref >60)
GFR calc non Af Amer: >60 mL/min (ref >60)
Glucose, Bld: 111 mg/dL — ABNORMAL HIGH (ref 70–99)
Potassium: 4.7 mmol/L (ref 3.5–5.1)
Sodium: 135 mmol/L (ref 135–145)

## 2019-03-14 NOTE — Progress Notes (Signed)
Chest x-ray consistent with hemidiaphragm secondary to nerve block, which includes Exparel, so may have a delayed recovery.  Plan to keep him overnight, monitor for hypoxia, and resolution of shortness of breath.  Cancel discharge.  Steve Bridge, MD

## 2019-03-14 NOTE — Evaluation (Signed)
Occupational Therapy Evaluation Patient Details Name: Steve Levy MRN: [id number] DOB: [birth year] Today's Date: 03/14/2019    History of Present Illness LEFT TOTAL SHOULDER ARTHROPLASTY   Clinical Impression   OT eval and education complete.  Handout provided.  Educated in ADL activity as well as elbow, wrist and hand ROM as ordered by MD    Follow Up Recommendations  Follow surgeon's recommendation for DC plan and follow-up therapies    Equipment Recommendations  None recommended by OT    Recommendations for Other Services       Precautions / Restrictions Precautions Precautions: Shoulder Type of Shoulder Precautions: no shoulder movement.  Elbow, wrist and hand ROM only Shoulder Interventions: Shoulder sling/immobilizer      Mobility Bed Mobility Overal bed mobility: Independent                Transfers Overall transfer level: Independent                        ADL either performed or assessed with clinical judgement         Vision Patient Visual Report: No change from baseline              Pertinent Vitals/Pain Pain Assessment: 0-10 Pain Score: 3  Pain Location: L shoulder Pain Descriptors / Indicators: Sore Pain Intervention(s): Limited activity within patient's tolerance;Monitored during session;Repositioned     Hand Dominance  Right   Extremity/Trunk Assessment Upper Extremity Assessment Upper Extremity Assessment: LUE deficits/detail LUE Deficits / Details: s/p sx              Cognition Arousal/Alertness: Awake/alert Behavior During Therapy: WFL for tasks assessed/performed Overall Cognitive Status: Within Functional Limits for tasks assessed                                           Shoulder Instructions Shoulder Instructions Donning/doffing shirt without moving shoulder: Minimal assistance;Patient able to independently direct caregiver Method for sponge bathing under operated UE: Minimal  assistance;Patient able to independently direct caregiver Donning/doffing sling/immobilizer: Minimal assistance;Patient able to independently direct caregiver Correct positioning of sling/immobilizer: Minimal assistance;Patient able to independently direct caregiver ROM for elbow, wrist and digits of operated UE: Minimal assistance;Patient able to independently direct caregiver Sling wearing schedule (on at all times/off for ADL's): Supervision/safety;Patient able to independently direct caregiver Proper positioning of operated UE when showering: Supervision/safety;Patient able to independently direct caregiver Positioning of UE while sleeping: Minimal assistance;Patient able to independently direct caregiver    Home Living Family/patient expects to be discharged to:: Private residence   Available Help at Discharge: Family Type of Home: House Home Access: Stairs to enter                                 OT Goals(Current goals can be found in the care plan section) Acute Rehab OT Goals Patient Stated Goal: home today OT Goal Formulation: With patient  OT Frequency:      AM-PAC OT "6 Clicks" Daily Activity     Outcome Measure Help from another person eating meals?: None Help from another person taking care of personal grooming?: None Help from another person toileting, which includes using toliet, bedpan, or urinal?: A Little Help from another person bathing (including washing, rinsing, drying)?: A Little Help from another person to  put on and taking off regular upper body clothing?: A Little Help from another person to put on and taking off regular lower body clothing?: A Little 6 Click Score: 20   End of Session Equipment Utilized During Treatment: Gait belt Nurse Communication: Other (comment)(pt did report he felt SOB after walking with OT. Sats 93 percent.  RN notified)  Activity Tolerance: Patient tolerated treatment well Patient left: in chair;with call  bell/phone within reach                   Time: 0915-0935 OT Time Calculation (min): 20 min Charges:  OT General Charges $OT Visit: 1 Visit OT Evaluation $OT Eval Low Complexity: 1 Low  Kari Baars, OT Acute Rehabilitation Services [phone] Office- [phone]     Aerica Rincon, Edwena Felty D 03/14/2019, 12:04 PM

## 2019-03-14 NOTE — Discharge Summary (Addendum)
Physician Discharge Summary  Patient ID: Steve Levy MRN: [id number] DOB/AGE: Sep 23, 1960 58 y.o.  Admit date: 03/13/2019 Discharge date: 03/15/2019  Admission Diagnoses:  Primary localized osteoarthrosis of left shoulder  Discharge Diagnoses:  Principal Problem:   Primary localized osteoarthrosis of left shoulder Active Problems:   Localized primary osteoarthritis of left shoulder region   Past Medical History:  Diagnosis Date  . Chronic schizoaffective disorder ([place])   . Hypertension   . Pancreatic pseudocyst 2014  . Primary localized osteoarthrosis of left shoulder 03/13/2019  . Renal cyst   . Urinary retention    After surgery    Surgeries: Procedure(s): TOTAL SHOULDER ARTHROPLASTY on 03/13/2019   Consultants (if any):   Discharged Condition: Improved  Hospital Course: Steve Levy is an 58 y.o. male who was admitted 03/13/2019 with a diagnosis of Primary localized osteoarthrosis of left shoulder and went to the operating room on 03/13/2019 and underwent the above named procedures.    He was given perioperative antibiotics:  Anti-infectives (From admission, onward)   Start     Dose/Rate Route Frequency Ordered Stop   03/13/19 1600  ceFAZolin (ANCEF) IVPB 1 g/50 mL premix     1 g 100 mL/hr over 30 Minutes Intravenous Every 6 hours 03/13/19 1437 03/14/19 0458   03/13/19 0745  ceFAZolin (ANCEF) IVPB 2g/100 mL premix     2 g 200 mL/hr over 30 Minutes Intravenous On call to O.R. 03/13/19 7672 03/13/19 1033    .  He was given sequential compression devices, early ambulation for DVT prophylaxis.  At the time of postoperative day 1 evaluation he still has numbness proximally, but had motor return in his distal extremity.  He developed shortness of breath with some hypoxia, CXR showed hemidiaphragm, likely due to the exparel block.  Monitored overnight and given oxygen and he improved.    He benefited maximally from the hospital stay and there were no complications.     Recent vital signs:  Vitals:   03/15/19 0151 03/15/19 0419  BP: 105/66 111/64  Pulse: 89 (!) 103  Resp: 17 20  Temp: 98.6 F (37 C) 98.2 F (36.8 C)  SpO2: 94% 90%    Recent laboratory studies:  Lab Results  Component Value Date   HGB 13.5 03/14/2019   HGB 14.9 03/05/2019   HGB 16.0 07/10/2015   Lab Results  Component Value Date   WBC 11.8 (H) 03/14/2019   PLT 156 03/14/2019   Lab Results  Component Value Date   INR 1.16 10/24/2012   Lab Results  Component Value Date   NA 135 03/14/2019   K 4.7 03/14/2019   CL 100 03/14/2019   CO2 25 03/14/2019   BUN 17 03/14/2019   CREATININE 1.06 03/14/2019   GLUCOSE 111 (H) 03/14/2019    Discharge Medications:   Allergies as of 03/15/2019   No Known Allergies     Medication List    TAKE these medications   baclofen 10 MG tablet Commonly known as: LIORESAL Take 1 tablet (10 mg total) by mouth 3 (three) times daily. As needed for muscle spasm   divalproex 500 MG DR tablet Commonly known as: DEPAKOTE Take 1,000 mg by mouth at bedtime.   lisinopril 20 MG tablet Commonly known as: ZESTRIL Take 20 mg by mouth at bedtime.   metoprolol succinate 50 MG 24 hr tablet Commonly known as: TOPROL-XL Take 50 mg by mouth at bedtime.   ondansetron 4 MG tablet Commonly known as: Zofran Take 1 tablet (4  mg total) by mouth every 8 (eight) hours as needed for nausea or vomiting.   oxyCODONE 5 MG immediate release tablet Commonly known as: Roxicodone Take 1 tablet (5 mg total) by mouth every 4 (four) hours as needed for severe pain.   QUEtiapine 400 MG tablet Commonly known as: SEROQUEL Take 800 mg by mouth at bedtime.   sennosides-docusate sodium 8.6-50 MG tablet Commonly known as: SENOKOT-S Take 2 tablets by mouth daily.       Diagnostic Studies: Dg Chest 2 View  Result Date: 03/14/2019 CLINICAL DATA:  Post shoulder surgery, sudden shortness of breath after ambulating to bathroom and bearing down for bowel movement  EXAM: CHEST - 2 VIEW COMPARISON:  12/21/2012 FINDINGS: Normal heart size, mediastinal contours, and pulmonary vascularity. Elevation of LEFT diaphragm with LEFT basilar atelectasis. Minimal RIGHT basilar atelectasis as well. Upper lungs clear. No pleural effusion or pneumothorax. IMPRESSION: Bibasilar atelectasis greater on LEFT. Electronically Signed   By: Lavonia Dana M.D.   On: 03/14/2019 14:08   Dg Shoulder Left Port  Result Date: 03/13/2019 CLINICAL DATA:  Status post left shoulder replacement. EXAM: LEFT SHOULDER - 1 VIEW COMPARISON:  None. FINDINGS: The humeral and glenoid components appear to be well situated. No fracture or dislocation is noted. Expected postoperative changes are seen in the surrounding soft tissues. IMPRESSION: Status post left total shoulder arthroplasty. Electronically Signed   By: Marijo Conception M.D.   On: 03/13/2019 15:47    Disposition:     Follow-up Information    Marchia Bond, MD. Schedule an appointment as soon as possible for a visit in 2 weeks.   Specialty: Orthopedic Surgery Contact information: [address] [place] [address] [phone]            Signed: Riot Bridge 03/15/2019, 8:19 AM

## 2019-03-14 NOTE — Progress Notes (Signed)
Patient c/o sudden SOB after ambulating to bathroom and bearing down for BM. Lung sounds auscultated, equal, clear. VS taken. Patient denies chest pain, pressure, tightness or pain with inspiration. Dr Mardelle Matte notified of situation. Rec'd orders for stat CXR. Patient made aware. Will monitor patient condition and watch for results.

## 2019-03-15 NOTE — Progress Notes (Signed)
Patient discharged to home w/ family. Given all belongings, instructions, equipment. All questions answered, verbalized understanding. No s/s sob, VSS. Escorted to pov via w/c.

## 2019-03-26 DIAGNOSIS — M19012 Primary osteoarthritis, left shoulder: Secondary | ICD-10-CM | POA: Diagnosis not present

## 2019-03-30 DIAGNOSIS — M79605 Pain in left leg: Secondary | ICD-10-CM | POA: Diagnosis not present

## 2019-03-30 DIAGNOSIS — I82402 Acute embolism and thrombosis of unspecified deep veins of left lower extremity: Secondary | ICD-10-CM | POA: Diagnosis not present

## 2019-03-30 DIAGNOSIS — F259 Schizoaffective disorder, unspecified: Secondary | ICD-10-CM | POA: Diagnosis not present

## 2019-03-30 DIAGNOSIS — Z79899 Other long term (current) drug therapy: Secondary | ICD-10-CM | POA: Diagnosis not present

## 2019-03-30 DIAGNOSIS — I82432 Acute embolism and thrombosis of left popliteal vein: Secondary | ICD-10-CM | POA: Diagnosis not present

## 2019-03-30 DIAGNOSIS — I10 Essential (primary) hypertension: Secondary | ICD-10-CM | POA: Diagnosis not present

## 2019-04-17 DIAGNOSIS — T8189XA Other complications of procedures, not elsewhere classified, initial encounter: Secondary | ICD-10-CM | POA: Diagnosis not present

## 2019-04-17 DIAGNOSIS — M19012 Primary osteoarthritis, left shoulder: Secondary | ICD-10-CM | POA: Diagnosis not present

## 2019-04-17 DIAGNOSIS — Z6828 Body mass index (BMI) 28.0-28.9, adult: Secondary | ICD-10-CM | POA: Diagnosis not present

## 2019-04-17 DIAGNOSIS — I10 Essential (primary) hypertension: Secondary | ICD-10-CM | POA: Diagnosis not present

## 2019-04-17 DIAGNOSIS — I82409 Acute embolism and thrombosis of unspecified deep veins of unspecified lower extremity: Secondary | ICD-10-CM | POA: Diagnosis not present

## 2019-04-23 DIAGNOSIS — Z96612 Presence of left artificial shoulder joint: Secondary | ICD-10-CM | POA: Diagnosis not present

## 2019-04-26 DIAGNOSIS — M25612 Stiffness of left shoulder, not elsewhere classified: Secondary | ICD-10-CM | POA: Diagnosis not present

## 2019-04-26 DIAGNOSIS — M25512 Pain in left shoulder: Secondary | ICD-10-CM | POA: Diagnosis not present

## 2019-04-26 DIAGNOSIS — M6281 Muscle weakness (generalized): Secondary | ICD-10-CM | POA: Diagnosis not present

## 2019-05-02 DIAGNOSIS — M25612 Stiffness of left shoulder, not elsewhere classified: Secondary | ICD-10-CM | POA: Diagnosis not present

## 2019-05-02 DIAGNOSIS — M25512 Pain in left shoulder: Secondary | ICD-10-CM | POA: Diagnosis not present

## 2019-05-02 DIAGNOSIS — M6281 Muscle weakness (generalized): Secondary | ICD-10-CM | POA: Diagnosis not present

## 2019-05-04 DIAGNOSIS — M6281 Muscle weakness (generalized): Secondary | ICD-10-CM | POA: Diagnosis not present

## 2019-05-04 DIAGNOSIS — M25612 Stiffness of left shoulder, not elsewhere classified: Secondary | ICD-10-CM | POA: Diagnosis not present

## 2019-05-04 DIAGNOSIS — M25512 Pain in left shoulder: Secondary | ICD-10-CM | POA: Diagnosis not present

## 2019-05-09 DIAGNOSIS — M25612 Stiffness of left shoulder, not elsewhere classified: Secondary | ICD-10-CM | POA: Diagnosis not present

## 2019-05-09 DIAGNOSIS — M6281 Muscle weakness (generalized): Secondary | ICD-10-CM | POA: Diagnosis not present

## 2019-05-09 DIAGNOSIS — M25512 Pain in left shoulder: Secondary | ICD-10-CM | POA: Diagnosis not present

## 2019-05-11 DIAGNOSIS — M25612 Stiffness of left shoulder, not elsewhere classified: Secondary | ICD-10-CM | POA: Diagnosis not present

## 2019-05-11 DIAGNOSIS — M25512 Pain in left shoulder: Secondary | ICD-10-CM | POA: Diagnosis not present

## 2019-05-11 DIAGNOSIS — M6281 Muscle weakness (generalized): Secondary | ICD-10-CM | POA: Diagnosis not present

## 2019-05-15 DIAGNOSIS — M25512 Pain in left shoulder: Secondary | ICD-10-CM | POA: Diagnosis not present

## 2019-05-15 DIAGNOSIS — M6281 Muscle weakness (generalized): Secondary | ICD-10-CM | POA: Diagnosis not present

## 2019-05-15 DIAGNOSIS — M25612 Stiffness of left shoulder, not elsewhere classified: Secondary | ICD-10-CM | POA: Diagnosis not present

## 2019-05-17 DIAGNOSIS — M25612 Stiffness of left shoulder, not elsewhere classified: Secondary | ICD-10-CM | POA: Diagnosis not present

## 2019-05-17 DIAGNOSIS — M25512 Pain in left shoulder: Secondary | ICD-10-CM | POA: Diagnosis not present

## 2019-05-17 DIAGNOSIS — M6281 Muscle weakness (generalized): Secondary | ICD-10-CM | POA: Diagnosis not present

## 2019-05-21 DIAGNOSIS — Z96612 Presence of left artificial shoulder joint: Secondary | ICD-10-CM | POA: Diagnosis not present

## 2019-05-23 DIAGNOSIS — M25612 Stiffness of left shoulder, not elsewhere classified: Secondary | ICD-10-CM | POA: Diagnosis not present

## 2019-05-23 DIAGNOSIS — M6281 Muscle weakness (generalized): Secondary | ICD-10-CM | POA: Diagnosis not present

## 2019-05-23 DIAGNOSIS — M25512 Pain in left shoulder: Secondary | ICD-10-CM | POA: Diagnosis not present

## 2019-05-25 DIAGNOSIS — M25512 Pain in left shoulder: Secondary | ICD-10-CM | POA: Diagnosis not present

## 2019-05-25 DIAGNOSIS — M25612 Stiffness of left shoulder, not elsewhere classified: Secondary | ICD-10-CM | POA: Diagnosis not present

## 2019-05-25 DIAGNOSIS — M6281 Muscle weakness (generalized): Secondary | ICD-10-CM | POA: Diagnosis not present

## 2019-05-29 DIAGNOSIS — F250 Schizoaffective disorder, bipolar type: Secondary | ICD-10-CM | POA: Diagnosis not present

## 2019-05-30 DIAGNOSIS — M25512 Pain in left shoulder: Secondary | ICD-10-CM | POA: Diagnosis not present

## 2019-05-30 DIAGNOSIS — M6281 Muscle weakness (generalized): Secondary | ICD-10-CM | POA: Diagnosis not present

## 2019-05-30 DIAGNOSIS — M25612 Stiffness of left shoulder, not elsewhere classified: Secondary | ICD-10-CM | POA: Diagnosis not present

## 2019-06-04 DIAGNOSIS — M25512 Pain in left shoulder: Secondary | ICD-10-CM | POA: Diagnosis not present

## 2019-06-04 DIAGNOSIS — M25612 Stiffness of left shoulder, not elsewhere classified: Secondary | ICD-10-CM | POA: Diagnosis not present

## 2019-06-04 DIAGNOSIS — M6281 Muscle weakness (generalized): Secondary | ICD-10-CM | POA: Diagnosis not present

## 2019-06-21 DIAGNOSIS — M6281 Muscle weakness (generalized): Secondary | ICD-10-CM | POA: Diagnosis not present

## 2019-06-21 DIAGNOSIS — M25512 Pain in left shoulder: Secondary | ICD-10-CM | POA: Diagnosis not present

## 2019-06-21 DIAGNOSIS — M25612 Stiffness of left shoulder, not elsewhere classified: Secondary | ICD-10-CM | POA: Diagnosis not present

## 2019-06-27 DIAGNOSIS — F259 Schizoaffective disorder, unspecified: Secondary | ICD-10-CM | POA: Diagnosis not present

## 2019-06-27 DIAGNOSIS — R000 Tachycardia, unspecified: Secondary | ICD-10-CM | POA: Diagnosis not present

## 2019-06-27 DIAGNOSIS — Z6828 Body mass index (BMI) 28.0-28.9, adult: Secondary | ICD-10-CM | POA: Diagnosis not present

## 2019-06-27 DIAGNOSIS — N401 Enlarged prostate with lower urinary tract symptoms: Secondary | ICD-10-CM | POA: Diagnosis not present

## 2019-06-27 DIAGNOSIS — R338 Other retention of urine: Secondary | ICD-10-CM | POA: Diagnosis not present

## 2019-06-27 DIAGNOSIS — I82409 Acute embolism and thrombosis of unspecified deep veins of unspecified lower extremity: Secondary | ICD-10-CM | POA: Diagnosis not present

## 2019-06-27 DIAGNOSIS — T8189XA Other complications of procedures, not elsewhere classified, initial encounter: Secondary | ICD-10-CM | POA: Diagnosis not present

## 2019-06-27 DIAGNOSIS — I10 Essential (primary) hypertension: Secondary | ICD-10-CM | POA: Diagnosis not present

## 2019-06-27 DIAGNOSIS — Z79899 Other long term (current) drug therapy: Secondary | ICD-10-CM | POA: Diagnosis not present

## 2019-07-16 DIAGNOSIS — Z96612 Presence of left artificial shoulder joint: Secondary | ICD-10-CM | POA: Diagnosis not present

## 2019-10-01 DIAGNOSIS — Z96612 Presence of left artificial shoulder joint: Secondary | ICD-10-CM | POA: Diagnosis not present

## 2019-10-23 DIAGNOSIS — R972 Elevated prostate specific antigen [PSA]: Secondary | ICD-10-CM | POA: Diagnosis not present

## 2019-10-30 DIAGNOSIS — N5201 Erectile dysfunction due to arterial insufficiency: Secondary | ICD-10-CM | POA: Diagnosis not present

## 2019-10-30 DIAGNOSIS — R972 Elevated prostate specific antigen [PSA]: Secondary | ICD-10-CM | POA: Diagnosis not present

## 2019-11-27 DIAGNOSIS — F250 Schizoaffective disorder, bipolar type: Secondary | ICD-10-CM | POA: Diagnosis not present

## 2019-12-19 DIAGNOSIS — R310 Gross hematuria: Secondary | ICD-10-CM | POA: Diagnosis not present

## 2019-12-19 DIAGNOSIS — R972 Elevated prostate specific antigen [PSA]: Secondary | ICD-10-CM | POA: Diagnosis not present

## 2019-12-25 DIAGNOSIS — R338 Other retention of urine: Secondary | ICD-10-CM | POA: Diagnosis not present

## 2019-12-25 DIAGNOSIS — N401 Enlarged prostate with lower urinary tract symptoms: Secondary | ICD-10-CM | POA: Diagnosis not present

## 2019-12-25 DIAGNOSIS — Z79899 Other long term (current) drug therapy: Secondary | ICD-10-CM | POA: Diagnosis not present

## 2019-12-25 DIAGNOSIS — I10 Essential (primary) hypertension: Secondary | ICD-10-CM | POA: Diagnosis not present

## 2019-12-25 DIAGNOSIS — Z6828 Body mass index (BMI) 28.0-28.9, adult: Secondary | ICD-10-CM | POA: Diagnosis not present

## 2019-12-25 DIAGNOSIS — R000 Tachycardia, unspecified: Secondary | ICD-10-CM | POA: Diagnosis not present

## 2019-12-25 DIAGNOSIS — F259 Schizoaffective disorder, unspecified: Secondary | ICD-10-CM | POA: Diagnosis not present

## 2019-12-26 DIAGNOSIS — R310 Gross hematuria: Secondary | ICD-10-CM | POA: Diagnosis not present

## 2020-01-09 DIAGNOSIS — R310 Gross hematuria: Secondary | ICD-10-CM | POA: Diagnosis not present

## 2020-05-28 DIAGNOSIS — F250 Schizoaffective disorder, bipolar type: Secondary | ICD-10-CM | POA: Diagnosis not present

## 2020-06-26 DIAGNOSIS — Z1331 Encounter for screening for depression: Secondary | ICD-10-CM | POA: Diagnosis not present

## 2020-06-26 DIAGNOSIS — R000 Tachycardia, unspecified: Secondary | ICD-10-CM | POA: Diagnosis not present

## 2020-06-26 DIAGNOSIS — R972 Elevated prostate specific antigen [PSA]: Secondary | ICD-10-CM | POA: Diagnosis not present

## 2020-06-26 DIAGNOSIS — Z79899 Other long term (current) drug therapy: Secondary | ICD-10-CM | POA: Diagnosis not present

## 2020-06-26 DIAGNOSIS — N401 Enlarged prostate with lower urinary tract symptoms: Secondary | ICD-10-CM | POA: Diagnosis not present

## 2020-06-26 DIAGNOSIS — F259 Schizoaffective disorder, unspecified: Secondary | ICD-10-CM | POA: Diagnosis not present

## 2020-06-26 DIAGNOSIS — Z23 Encounter for immunization: Secondary | ICD-10-CM | POA: Diagnosis not present

## 2020-06-26 DIAGNOSIS — I10 Essential (primary) hypertension: Secondary | ICD-10-CM | POA: Diagnosis not present

## 2020-06-26 DIAGNOSIS — R338 Other retention of urine: Secondary | ICD-10-CM | POA: Diagnosis not present

## 2020-11-26 DIAGNOSIS — F250 Schizoaffective disorder, bipolar type: Secondary | ICD-10-CM | POA: Diagnosis not present

## 2020-12-25 DIAGNOSIS — N401 Enlarged prostate with lower urinary tract symptoms: Secondary | ICD-10-CM | POA: Diagnosis not present

## 2020-12-25 DIAGNOSIS — E7800 Pure hypercholesterolemia, unspecified: Secondary | ICD-10-CM | POA: Diagnosis not present

## 2020-12-25 DIAGNOSIS — F259 Schizoaffective disorder, unspecified: Secondary | ICD-10-CM | POA: Diagnosis not present

## 2020-12-25 DIAGNOSIS — Z79899 Other long term (current) drug therapy: Secondary | ICD-10-CM | POA: Diagnosis not present

## 2020-12-25 DIAGNOSIS — R000 Tachycardia, unspecified: Secondary | ICD-10-CM | POA: Diagnosis not present

## 2020-12-25 DIAGNOSIS — R972 Elevated prostate specific antigen [PSA]: Secondary | ICD-10-CM | POA: Diagnosis not present

## 2020-12-25 DIAGNOSIS — I10 Essential (primary) hypertension: Secondary | ICD-10-CM | POA: Diagnosis not present

## 2020-12-25 DIAGNOSIS — R338 Other retention of urine: Secondary | ICD-10-CM | POA: Diagnosis not present

## 2020-12-25 DIAGNOSIS — Z6828 Body mass index (BMI) 28.0-28.9, adult: Secondary | ICD-10-CM | POA: Diagnosis not present

## 2021-02-05 DIAGNOSIS — R972 Elevated prostate specific antigen [PSA]: Secondary | ICD-10-CM | POA: Diagnosis not present

## 2021-02-12 DIAGNOSIS — R972 Elevated prostate specific antigen [PSA]: Secondary | ICD-10-CM | POA: Diagnosis not present

## 2021-02-12 DIAGNOSIS — N401 Enlarged prostate with lower urinary tract symptoms: Secondary | ICD-10-CM | POA: Diagnosis not present

## 2021-02-12 DIAGNOSIS — R310 Gross hematuria: Secondary | ICD-10-CM | POA: Diagnosis not present

## 2021-02-12 DIAGNOSIS — R3915 Urgency of urination: Secondary | ICD-10-CM | POA: Diagnosis not present

## 2021-05-01 IMAGING — DX CHEST - 2 VIEW
2 series · 2 of 2 positions shown · non-contrast
Comparison: 12/21/2012

CLINICAL DATA: Post shoulder surgery, sudden shortness of breath
after ambulating to bathroom and bearing down for bowel movement

EXAM:
CHEST - 2 VIEW

[chest pa]
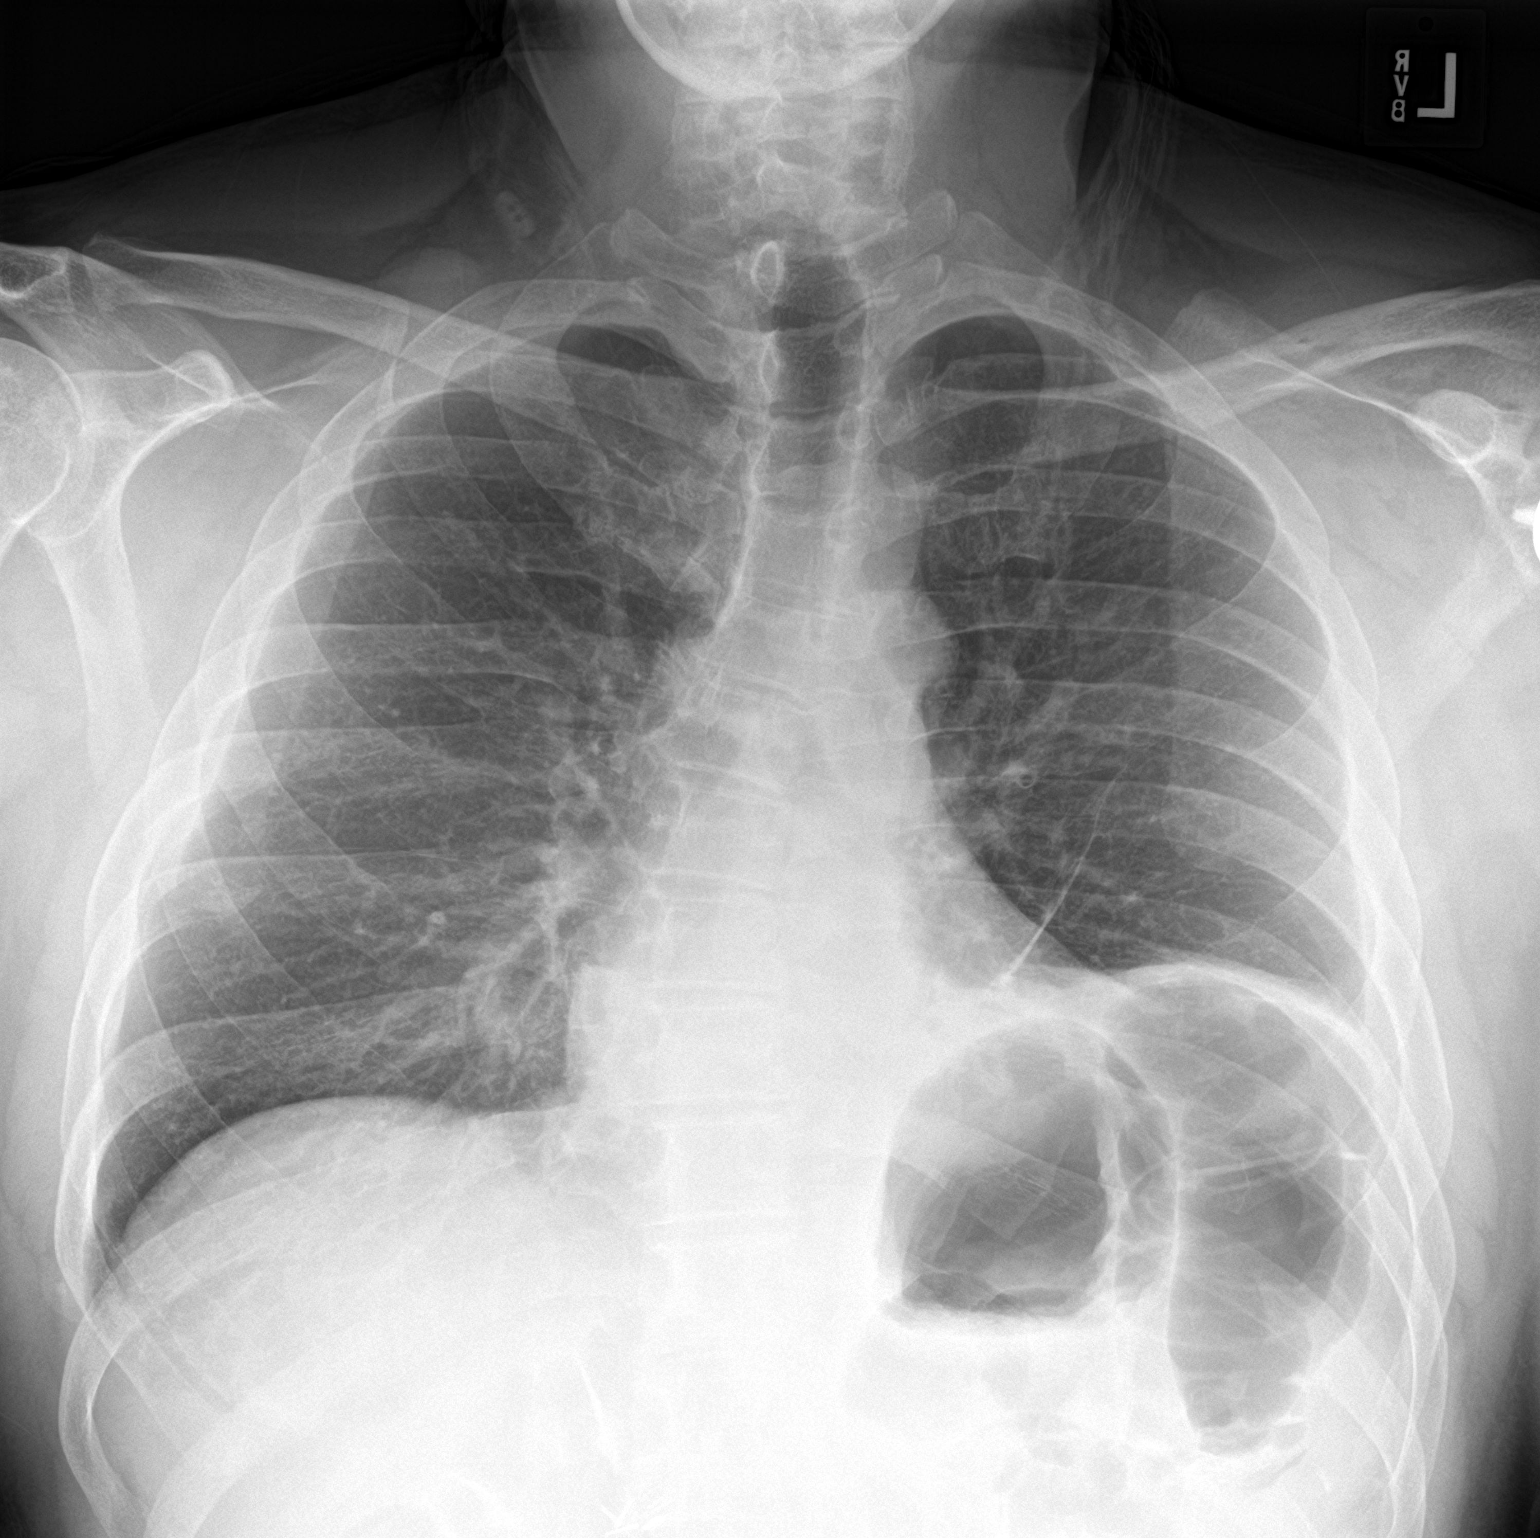

[chest lat]
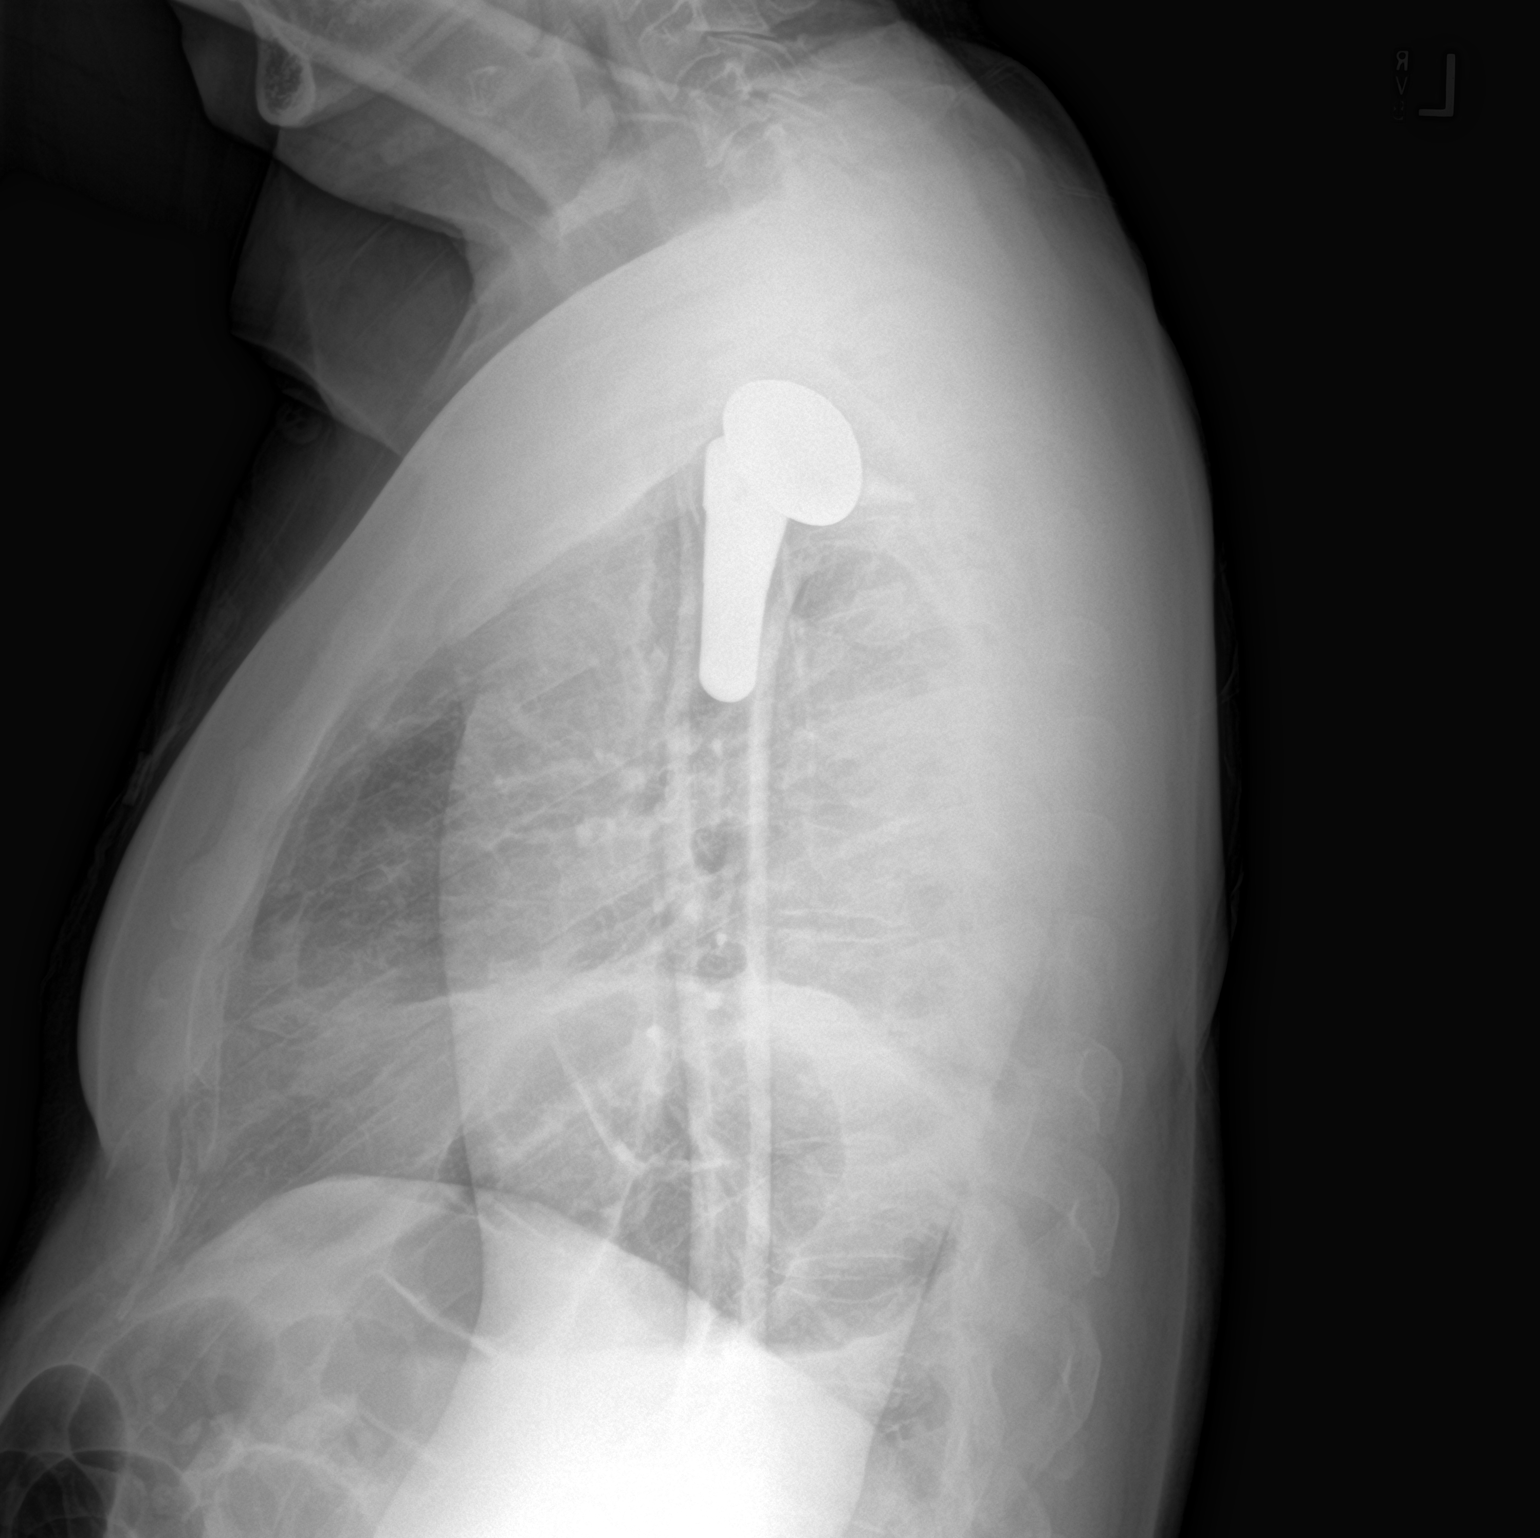

[2 of 2 positions shown; findings below may reference images not displayed]

FINDINGS: Normal heart size, mediastinal contours, and pulmonary vascularity.

Elevation of LEFT diaphragm with LEFT basilar atelectasis.

Minimal RIGHT basilar atelectasis as well.

Upper lungs clear.

No pleural effusion or pneumothorax.
IMPRESSION: Bibasilar atelectasis greater on LEFT.

## 2021-06-04 DIAGNOSIS — R972 Elevated prostate specific antigen [PSA]: Secondary | ICD-10-CM | POA: Diagnosis not present

## 2021-06-12 ENCOUNTER — Other Ambulatory Visit: Payer: Self-pay | Admitting: Urology

## 2021-06-12 DIAGNOSIS — R972 Elevated prostate specific antigen [PSA]: Secondary | ICD-10-CM

## 2021-06-16 DIAGNOSIS — R339 Retention of urine, unspecified: Secondary | ICD-10-CM | POA: Diagnosis not present

## 2021-06-30 ENCOUNTER — Other Ambulatory Visit: Payer: Self-pay

## 2021-06-30 ENCOUNTER — Ambulatory Visit
Admission: RE | Admit: 2021-06-30 | Discharge: 2021-06-30 | Disposition: A | Discharge status: Home / Self Care | Payer: Medicare HMO | Source: Ambulatory Visit | Attending: Urology | Admitting: Urology

## 2021-06-30 DIAGNOSIS — R972 Elevated prostate specific antigen [PSA]: Secondary | ICD-10-CM | POA: Diagnosis not present

## 2021-06-30 DIAGNOSIS — N400 Enlarged prostate without lower urinary tract symptoms: Secondary | ICD-10-CM | POA: Diagnosis not present

## 2021-06-30 DIAGNOSIS — R590 Localized enlarged lymph nodes: Secondary | ICD-10-CM | POA: Diagnosis not present

## 2021-06-30 MED ORDER — GADOBENATE DIMEGLUMINE 529 MG/ML IV SOLN
17.0000 mL | Freq: Once | INTRAVENOUS | Status: AC | PRN
  Administered 2021-06-30: 17 mL via INTRAVENOUS

## 2021-07-02 ENCOUNTER — Other Ambulatory Visit: Payer: Self-pay | Admitting: Urology

## 2021-07-02 DIAGNOSIS — R338 Other retention of urine: Secondary | ICD-10-CM | POA: Diagnosis not present

## 2021-07-02 DIAGNOSIS — N401 Enlarged prostate with lower urinary tract symptoms: Secondary | ICD-10-CM | POA: Diagnosis not present

## 2021-07-02 DIAGNOSIS — R972 Elevated prostate specific antigen [PSA]: Secondary | ICD-10-CM | POA: Diagnosis not present

## 2021-07-02 DIAGNOSIS — F259 Schizoaffective disorder, unspecified: Secondary | ICD-10-CM | POA: Diagnosis not present

## 2021-07-02 DIAGNOSIS — Z23 Encounter for immunization: Secondary | ICD-10-CM | POA: Diagnosis not present

## 2021-07-02 DIAGNOSIS — Z79899 Other long term (current) drug therapy: Secondary | ICD-10-CM | POA: Diagnosis not present

## 2021-07-02 DIAGNOSIS — I10 Essential (primary) hypertension: Secondary | ICD-10-CM | POA: Diagnosis not present

## 2021-07-02 DIAGNOSIS — R000 Tachycardia, unspecified: Secondary | ICD-10-CM | POA: Diagnosis not present

## 2021-07-02 DIAGNOSIS — E7800 Pure hypercholesterolemia, unspecified: Secondary | ICD-10-CM | POA: Diagnosis not present

## 2021-07-09 DIAGNOSIS — R350 Frequency of micturition: Secondary | ICD-10-CM | POA: Diagnosis not present

## 2021-07-09 DIAGNOSIS — R972 Elevated prostate specific antigen [PSA]: Secondary | ICD-10-CM | POA: Diagnosis not present

## 2021-07-09 DIAGNOSIS — N401 Enlarged prostate with lower urinary tract symptoms: Secondary | ICD-10-CM | POA: Diagnosis not present

## 2021-07-15 DIAGNOSIS — F250 Schizoaffective disorder, bipolar type: Secondary | ICD-10-CM | POA: Diagnosis not present

## 2021-08-12 DIAGNOSIS — N419 Inflammatory disease of prostate, unspecified: Secondary | ICD-10-CM | POA: Diagnosis not present

## 2021-08-12 DIAGNOSIS — R972 Elevated prostate specific antigen [PSA]: Secondary | ICD-10-CM | POA: Diagnosis not present

## 2021-08-13 DIAGNOSIS — H40033 Anatomical narrow angle, bilateral: Secondary | ICD-10-CM | POA: Diagnosis not present

## 2021-08-13 DIAGNOSIS — H2513 Age-related nuclear cataract, bilateral: Secondary | ICD-10-CM | POA: Diagnosis not present

## 2022-01-01 DIAGNOSIS — R972 Elevated prostate specific antigen [PSA]: Secondary | ICD-10-CM | POA: Diagnosis not present

## 2022-01-01 DIAGNOSIS — R000 Tachycardia, unspecified: Secondary | ICD-10-CM | POA: Diagnosis not present

## 2022-01-01 DIAGNOSIS — Z79899 Other long term (current) drug therapy: Secondary | ICD-10-CM | POA: Diagnosis not present

## 2022-01-01 DIAGNOSIS — E7800 Pure hypercholesterolemia, unspecified: Secondary | ICD-10-CM | POA: Diagnosis not present

## 2022-01-01 DIAGNOSIS — R338 Other retention of urine: Secondary | ICD-10-CM | POA: Diagnosis not present

## 2022-01-01 DIAGNOSIS — Z1331 Encounter for screening for depression: Secondary | ICD-10-CM | POA: Diagnosis not present

## 2022-01-01 DIAGNOSIS — F259 Schizoaffective disorder, unspecified: Secondary | ICD-10-CM | POA: Diagnosis not present

## 2022-01-01 DIAGNOSIS — I10 Essential (primary) hypertension: Secondary | ICD-10-CM | POA: Diagnosis not present

## 2022-01-01 DIAGNOSIS — N401 Enlarged prostate with lower urinary tract symptoms: Secondary | ICD-10-CM | POA: Diagnosis not present

## 2022-02-01 DIAGNOSIS — R972 Elevated prostate specific antigen [PSA]: Secondary | ICD-10-CM | POA: Diagnosis not present

## 2022-02-03 DIAGNOSIS — F250 Schizoaffective disorder, bipolar type: Secondary | ICD-10-CM | POA: Diagnosis not present

## 2022-02-08 DIAGNOSIS — R972 Elevated prostate specific antigen [PSA]: Secondary | ICD-10-CM | POA: Diagnosis not present

## 2022-02-08 DIAGNOSIS — N401 Enlarged prostate with lower urinary tract symptoms: Secondary | ICD-10-CM | POA: Diagnosis not present

## 2022-02-08 DIAGNOSIS — R350 Frequency of micturition: Secondary | ICD-10-CM | POA: Diagnosis not present

## 2022-07-12 DIAGNOSIS — N401 Enlarged prostate with lower urinary tract symptoms: Secondary | ICD-10-CM | POA: Diagnosis not present

## 2022-07-12 DIAGNOSIS — I10 Essential (primary) hypertension: Secondary | ICD-10-CM | POA: Diagnosis not present

## 2022-07-12 DIAGNOSIS — R338 Other retention of urine: Secondary | ICD-10-CM | POA: Diagnosis not present

## 2022-07-12 DIAGNOSIS — R000 Tachycardia, unspecified: Secondary | ICD-10-CM | POA: Diagnosis not present

## 2022-07-12 DIAGNOSIS — Z23 Encounter for immunization: Secondary | ICD-10-CM | POA: Diagnosis not present

## 2022-07-12 DIAGNOSIS — F259 Schizoaffective disorder, unspecified: Secondary | ICD-10-CM | POA: Diagnosis not present

## 2022-07-12 DIAGNOSIS — E7800 Pure hypercholesterolemia, unspecified: Secondary | ICD-10-CM | POA: Diagnosis not present

## 2022-07-12 DIAGNOSIS — R972 Elevated prostate specific antigen [PSA]: Secondary | ICD-10-CM | POA: Diagnosis not present

## 2022-07-12 DIAGNOSIS — Z79899 Other long term (current) drug therapy: Secondary | ICD-10-CM | POA: Diagnosis not present

## 2022-08-03 DIAGNOSIS — R972 Elevated prostate specific antigen [PSA]: Secondary | ICD-10-CM | POA: Diagnosis not present

## 2022-08-03 DIAGNOSIS — F250 Schizoaffective disorder, bipolar type: Secondary | ICD-10-CM | POA: Diagnosis not present

## 2022-08-10 DIAGNOSIS — N401 Enlarged prostate with lower urinary tract symptoms: Secondary | ICD-10-CM | POA: Diagnosis not present

## 2022-08-10 DIAGNOSIS — R350 Frequency of micturition: Secondary | ICD-10-CM | POA: Diagnosis not present

## 2022-08-10 DIAGNOSIS — R972 Elevated prostate specific antigen [PSA]: Secondary | ICD-10-CM | POA: Diagnosis not present

## 2023-01-28 DIAGNOSIS — I10 Essential (primary) hypertension: Secondary | ICD-10-CM | POA: Diagnosis not present

## 2023-01-28 DIAGNOSIS — Z1211 Encounter for screening for malignant neoplasm of colon: Secondary | ICD-10-CM | POA: Diagnosis not present

## 2023-01-28 DIAGNOSIS — R972 Elevated prostate specific antigen [PSA]: Secondary | ICD-10-CM | POA: Diagnosis not present

## 2023-01-28 DIAGNOSIS — Z1331 Encounter for screening for depression: Secondary | ICD-10-CM | POA: Diagnosis not present

## 2023-01-28 DIAGNOSIS — E7800 Pure hypercholesterolemia, unspecified: Secondary | ICD-10-CM | POA: Diagnosis not present

## 2023-01-28 DIAGNOSIS — R000 Tachycardia, unspecified: Secondary | ICD-10-CM | POA: Diagnosis not present

## 2023-01-28 DIAGNOSIS — Z79899 Other long term (current) drug therapy: Secondary | ICD-10-CM | POA: Diagnosis not present

## 2023-01-28 DIAGNOSIS — F259 Schizoaffective disorder, unspecified: Secondary | ICD-10-CM | POA: Diagnosis not present

## 2023-01-31 DIAGNOSIS — R972 Elevated prostate specific antigen [PSA]: Secondary | ICD-10-CM | POA: Diagnosis not present

## 2023-02-07 DIAGNOSIS — N401 Enlarged prostate with lower urinary tract symptoms: Secondary | ICD-10-CM | POA: Diagnosis not present

## 2023-02-07 DIAGNOSIS — R972 Elevated prostate specific antigen [PSA]: Secondary | ICD-10-CM | POA: Diagnosis not present

## 2023-02-07 DIAGNOSIS — R350 Frequency of micturition: Secondary | ICD-10-CM | POA: Diagnosis not present

## 2023-06-08 ENCOUNTER — Other Ambulatory Visit: Payer: Self-pay | Admitting: Urology

## 2023-06-08 DIAGNOSIS — R972 Elevated prostate specific antigen [PSA]: Secondary | ICD-10-CM

## 2023-06-21 DIAGNOSIS — F250 Schizoaffective disorder, bipolar type: Secondary | ICD-10-CM | POA: Diagnosis not present

## 2023-07-04 ENCOUNTER — Encounter: Payer: Self-pay | Admitting: Urology

## 2023-07-08 ENCOUNTER — Ambulatory Visit
Admission: RE | Admit: 2023-07-08 | Discharge: 2023-07-08 | Disposition: A | Discharge status: Home / Self Care | Payer: Medicare HMO | Source: Ambulatory Visit | Attending: Urology

## 2023-07-08 DIAGNOSIS — R972 Elevated prostate specific antigen [PSA]: Secondary | ICD-10-CM | POA: Diagnosis not present

## 2023-07-08 MED ORDER — GADOPICLENOL 0.5 MMOL/ML IV SOLN
10.0000 mL | Freq: Once | INTRAVENOUS | Status: AC | PRN
  Administered 2023-07-08: 8 mL via INTRAVENOUS

## 2023-08-02 DIAGNOSIS — H524 Presbyopia: Secondary | ICD-10-CM | POA: Diagnosis not present

## 2023-08-02 DIAGNOSIS — H5213 Myopia, bilateral: Secondary | ICD-10-CM | POA: Diagnosis not present

## 2023-08-02 DIAGNOSIS — R972 Elevated prostate specific antigen [PSA]: Secondary | ICD-10-CM | POA: Diagnosis not present

## 2023-08-03 DIAGNOSIS — F250 Schizoaffective disorder, bipolar type: Secondary | ICD-10-CM | POA: Diagnosis not present

## 2023-08-04 DIAGNOSIS — F259 Schizoaffective disorder, unspecified: Secondary | ICD-10-CM | POA: Diagnosis not present

## 2023-08-04 DIAGNOSIS — Z23 Encounter for immunization: Secondary | ICD-10-CM | POA: Diagnosis not present

## 2023-08-04 DIAGNOSIS — R972 Elevated prostate specific antigen [PSA]: Secondary | ICD-10-CM | POA: Diagnosis not present

## 2023-08-04 DIAGNOSIS — R000 Tachycardia, unspecified: Secondary | ICD-10-CM | POA: Diagnosis not present

## 2023-08-04 DIAGNOSIS — R599 Enlarged lymph nodes, unspecified: Secondary | ICD-10-CM | POA: Diagnosis not present

## 2023-08-04 DIAGNOSIS — E7800 Pure hypercholesterolemia, unspecified: Secondary | ICD-10-CM | POA: Diagnosis not present

## 2023-08-04 DIAGNOSIS — Z79899 Other long term (current) drug therapy: Secondary | ICD-10-CM | POA: Diagnosis not present

## 2023-08-04 DIAGNOSIS — Z1211 Encounter for screening for malignant neoplasm of colon: Secondary | ICD-10-CM | POA: Diagnosis not present

## 2023-08-04 DIAGNOSIS — I10 Essential (primary) hypertension: Secondary | ICD-10-CM | POA: Diagnosis not present

## 2023-08-09 DIAGNOSIS — R972 Elevated prostate specific antigen [PSA]: Secondary | ICD-10-CM | POA: Diagnosis not present

## 2023-08-09 DIAGNOSIS — N401 Enlarged prostate with lower urinary tract symptoms: Secondary | ICD-10-CM | POA: Diagnosis not present

## 2023-08-09 DIAGNOSIS — R350 Frequency of micturition: Secondary | ICD-10-CM | POA: Diagnosis not present

## 2023-11-08 DIAGNOSIS — Z1211 Encounter for screening for malignant neoplasm of colon: Secondary | ICD-10-CM | POA: Diagnosis not present

## 2023-11-08 DIAGNOSIS — R338 Other retention of urine: Secondary | ICD-10-CM | POA: Diagnosis not present

## 2023-11-08 DIAGNOSIS — N401 Enlarged prostate with lower urinary tract symptoms: Secondary | ICD-10-CM | POA: Diagnosis not present

## 2023-11-08 DIAGNOSIS — Z1212 Encounter for screening for malignant neoplasm of rectum: Secondary | ICD-10-CM | POA: Diagnosis not present

## 2023-12-07 DIAGNOSIS — D490 Neoplasm of unspecified behavior of digestive system: Secondary | ICD-10-CM | POA: Diagnosis not present

## 2023-12-07 DIAGNOSIS — Z1211 Encounter for screening for malignant neoplasm of colon: Secondary | ICD-10-CM | POA: Diagnosis not present

## 2023-12-07 DIAGNOSIS — D123 Benign neoplasm of transverse colon: Secondary | ICD-10-CM | POA: Diagnosis not present

## 2023-12-07 DIAGNOSIS — D125 Benign neoplasm of sigmoid colon: Secondary | ICD-10-CM | POA: Diagnosis not present

## 2023-12-07 LAB — HM COLONOSCOPY

## 2023-12-08 DIAGNOSIS — D125 Benign neoplasm of sigmoid colon: Secondary | ICD-10-CM | POA: Diagnosis not present

## 2023-12-08 DIAGNOSIS — D123 Benign neoplasm of transverse colon: Secondary | ICD-10-CM | POA: Diagnosis not present

## 2023-12-22 ENCOUNTER — Telehealth: Payer: Self-pay | Admitting: Gastroenterology

## 2023-12-22 DIAGNOSIS — Z860100 Personal history of colon polyps, unspecified: Secondary | ICD-10-CM

## 2023-12-22 NOTE — Telephone Encounter (Signed)
 Good morning Dr. Brice Campi,    Patient called stating that Dr. Honey Lusty had sent over a referral you for him to be seen for removal of  Tubulovillous Adenoma of colon. I advised patient I needed to send a message for you to review the referral with his records.    Will you please review and advise on scheduling patient?   Thank you.

## 2023-12-23 NOTE — Telephone Encounter (Signed)
 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 Color pictures still pending being obtained.  Patient can be scheduled for a clinic visit with me (okay for overbook or held slot or 3:50 PM slot). Patient can be scheduled colonoscopy with EMR in the next 3 months 90-minute case. If he wants to wait until clinic to schedule that is up to the patient. Please let me know as well as Dr. Honey Lusty when the patient has been scheduled. Thanks. GM

## 2024-01-12 ENCOUNTER — Other Ambulatory Visit: Payer: Self-pay

## 2024-01-12 DIAGNOSIS — Z860100 Personal history of colon polyps, unspecified: Secondary | ICD-10-CM

## 2024-01-12 MED ORDER — NA SULFATE-K SULFATE-MG SULF 17.5-3.13-1.6 GM/177ML PO SOLN: 1.0000 | ORAL | 0 refills | Status: DC

## 2024-01-12 NOTE — Addendum Note (Signed)
 Addended by: Patricio Boop on: 01/12/2024 10:23 AM   Modules accepted: Orders

## 2024-01-12 NOTE — Telephone Encounter (Signed)
 Fyi-   Patient scheduled for OV 02/10/24 with Dr Brice Campi. Patient scheduled for Colon +EMR @ [facility] 03/20/24. All instructions sent to patient via mail. Patient will also be instructed at OV. Patient voiced understanding.

## 2024-01-31 DIAGNOSIS — R972 Elevated prostate specific antigen [PSA]: Secondary | ICD-10-CM | POA: Diagnosis not present

## 2024-02-01 DIAGNOSIS — F250 Schizoaffective disorder, bipolar type: Secondary | ICD-10-CM | POA: Diagnosis not present

## 2024-02-01 DIAGNOSIS — I82402 Acute embolism and thrombosis of unspecified deep veins of left lower extremity: Secondary | ICD-10-CM | POA: Diagnosis not present

## 2024-02-01 DIAGNOSIS — I10 Essential (primary) hypertension: Secondary | ICD-10-CM | POA: Diagnosis not present

## 2024-02-01 DIAGNOSIS — M7989 Other specified soft tissue disorders: Secondary | ICD-10-CM | POA: Diagnosis not present

## 2024-02-01 DIAGNOSIS — I82432 Acute embolism and thrombosis of left popliteal vein: Secondary | ICD-10-CM | POA: Diagnosis not present

## 2024-02-01 DIAGNOSIS — I82622 Acute embolism and thrombosis of deep veins of left upper extremity: Secondary | ICD-10-CM | POA: Diagnosis not present

## 2024-02-01 DIAGNOSIS — I82412 Acute embolism and thrombosis of left femoral vein: Secondary | ICD-10-CM | POA: Diagnosis not present

## 2024-02-01 DIAGNOSIS — Z7901 Long term (current) use of anticoagulants: Secondary | ICD-10-CM | POA: Diagnosis not present

## 2024-02-07 DIAGNOSIS — H524 Presbyopia: Secondary | ICD-10-CM | POA: Diagnosis not present

## 2024-02-07 DIAGNOSIS — R972 Elevated prostate specific antigen [PSA]: Secondary | ICD-10-CM | POA: Diagnosis not present

## 2024-02-07 DIAGNOSIS — N401 Enlarged prostate with lower urinary tract symptoms: Secondary | ICD-10-CM | POA: Diagnosis not present

## 2024-02-07 DIAGNOSIS — R350 Frequency of micturition: Secondary | ICD-10-CM | POA: Diagnosis not present

## 2024-02-09 DIAGNOSIS — R000 Tachycardia, unspecified: Secondary | ICD-10-CM | POA: Diagnosis not present

## 2024-02-09 DIAGNOSIS — Z1331 Encounter for screening for depression: Secondary | ICD-10-CM | POA: Diagnosis not present

## 2024-02-09 DIAGNOSIS — I82402 Acute embolism and thrombosis of unspecified deep veins of left lower extremity: Secondary | ICD-10-CM | POA: Diagnosis not present

## 2024-02-09 DIAGNOSIS — I10 Essential (primary) hypertension: Secondary | ICD-10-CM | POA: Diagnosis not present

## 2024-02-09 DIAGNOSIS — Z9181 History of falling: Secondary | ICD-10-CM | POA: Diagnosis not present

## 2024-02-09 DIAGNOSIS — Z79899 Other long term (current) drug therapy: Secondary | ICD-10-CM | POA: Diagnosis not present

## 2024-02-09 DIAGNOSIS — F259 Schizoaffective disorder, unspecified: Secondary | ICD-10-CM | POA: Diagnosis not present

## 2024-02-09 DIAGNOSIS — R972 Elevated prostate specific antigen [PSA]: Secondary | ICD-10-CM | POA: Diagnosis not present

## 2024-02-09 DIAGNOSIS — E7800 Pure hypercholesterolemia, unspecified: Secondary | ICD-10-CM | POA: Diagnosis not present

## 2024-02-10 ENCOUNTER — Encounter: Payer: Self-pay | Admitting: Gastroenterology

## 2024-02-10 ENCOUNTER — Ambulatory Visit: Admitting: Gastroenterology

## 2024-02-10 ENCOUNTER — Other Ambulatory Visit

## 2024-02-10 VITALS — BP 110/80 | HR 72 | Ht 66.0 in | Wt 178.5 lb

## 2024-02-10 DIAGNOSIS — Z5181 Encounter for therapeutic drug level monitoring: Secondary | ICD-10-CM | POA: Diagnosis not present

## 2024-02-10 DIAGNOSIS — D120 Benign neoplasm of cecum: Secondary | ICD-10-CM | POA: Diagnosis not present

## 2024-02-10 DIAGNOSIS — Z7901 Long term (current) use of anticoagulants: Secondary | ICD-10-CM | POA: Diagnosis not present

## 2024-02-10 DIAGNOSIS — K635 Polyp of colon: Secondary | ICD-10-CM

## 2024-02-10 DIAGNOSIS — D126 Benign neoplasm of colon, unspecified: Secondary | ICD-10-CM

## 2024-02-10 DIAGNOSIS — Z860101 Personal history of adenomatous and serrated colon polyps: Secondary | ICD-10-CM

## 2024-02-10 LAB — BASIC METABOLIC PANEL WITH GFR
BUN: 16 mg/dL (ref 6–23)
CO2: 29 meq/L (ref 19–32)
Calcium: 8.5 mg/dL (ref 8.4–10.5)
Chloride: 104 meq/L (ref 96–112)
Creatinine, Ser: 1.11 mg/dL (ref 0.40–1.50)
GFR: 69.89 mL/min (ref >60.00)
Glucose, Bld: 86 mg/dL (ref 70–99)
Potassium: 4.2 meq/L (ref 3.5–5.1)
Sodium: 139 meq/L (ref 135–145)

## 2024-02-10 LAB — CBC
HCT: 44.6 % (ref 39.0–52.0)
Hemoglobin: 14.7 g/dL (ref 13.0–17.0)
MCHC: 32.9 g/dL (ref 30.0–36.0)
MCV: 89.8 fl (ref 78.0–100.0)
Platelets: 257 10*3/uL (ref 150.0–400.0)
RBC: 4.97 Mil/uL (ref 4.22–5.81)
RDW: 14.2 % (ref 11.5–15.5)
WBC: 5.9 10*3/uL (ref 4.0–10.5)

## 2024-02-10 NOTE — Patient Instructions (Signed)
 Your provider has requested that you go to the basement level for lab work before leaving today. Press B on the elevator. The lab is located at the first door on the left as you exit the elevator.  Your procedure has been r/s to 05/17/24 ( tentatively) depending on clearance of Eliquis approval. Office will contact you once we have clearance from Aloha Arnold, PA-C.   Due to recent changes in healthcare laws, you may see the results of your imaging and laboratory studies on MyChart before your provider has had a chance to review them.  We understand that in some cases there may be results that are confusing or concerning to you. Not all laboratory results come back in the same time frame and the provider may be waiting for multiple results in order to interpret others.  Please give us  48 hours in order for your provider to thoroughly review all the results before contacting the office for clarification of your results.   _______________________________________________________  If your blood pressure at your visit was 140/90 or greater, please contact your primary care physician to follow up on this.  _______________________________________________________  If you are age 58 or older, your body mass index should be between 23-30. Your Body mass index is 28.81 kg/m. If this is out of the aforementioned range listed, please consider follow up with your Primary Care Provider.  If you are age 78 or younger, your body mass index should be between 19-25. Your Body mass index is 28.81 kg/m. If this is out of the aformentioned range listed, please consider follow up with your Primary Care Provider.   ________________________________________________________  The [facility] providers would like to encourage you to use MYCHART to communicate with providers for non-urgent requests or questions.  Due to long hold times on the telephone, sending your provider a message by [facility] may be a faster and more  efficient way to get a response.  Please allow 48 business hours for a response.  Please remember that this is for non-urgent requests.  _______________________________________________________  Thank you for choosing me and [facility].  Dr. Brice Campi

## 2024-02-11 ENCOUNTER — Ambulatory Visit: Payer: Self-pay | Admitting: Gastroenterology

## 2024-02-11 ENCOUNTER — Encounter: Payer: Self-pay | Admitting: Gastroenterology

## 2024-02-11 DIAGNOSIS — K635 Polyp of colon: Secondary | ICD-10-CM | POA: Insufficient documentation

## 2024-02-11 DIAGNOSIS — D126 Benign neoplasm of colon, unspecified: Secondary | ICD-10-CM | POA: Insufficient documentation

## 2024-02-11 DIAGNOSIS — Z7901 Long term (current) use of anticoagulants: Secondary | ICD-10-CM | POA: Insufficient documentation

## 2024-02-11 DIAGNOSIS — Z860101 Personal history of adenomatous and serrated colon polyps: Secondary | ICD-10-CM | POA: Insufficient documentation

## 2024-02-11 NOTE — Progress Notes (Signed)
 GASTROENTEROLOGY OUTPATIENT CLINIC VISIT   Primary Care Provider Montey Lot, PA-C [address] [place] [address] [phone]  Referring Provider Dr. Dianna  Patient Profile: Steve Levy is a 63 y.o. male with a pmh significant for hypertension, hyperlipidemia, arthritis, schizoaffective disorder, colon polyps (TA's and cecal/ao TVA still in situ), hemorrhoids.  The patient presents to the [facility] for an evaluation and management of problem(s) noted below:  Problem List 1. Cecal polyp   2. Tubulovillous adenoma of colon   3. Chronic anticoagulation   4. Hx of adenomatous colonic polyps    Discussed the use of AI scribe software for clinical note transcription with the patient, who gave verbal consent to proceed.  History of Present Illness This is the patient's first visit to the [facility] clinic.  Steve Levy is a 63 year old male who presents for follow-up after a recent screening colonoscopy performed by Dr. Dianna revealed a precancerous adenoma within the cecum (near the appendix orifice).  He was approximately 5-years overdue for this colonoscopy but had no symptoms to be alarmed by.  He has a history of chronic constipation and hemorrhoids, although no bleeding has been noted.  He was just diagnosed and initiated on anticoagulation.  No current issues in regard to the use of his blood thinner.  He knows that this will likely push out his colonoscopy.   GI Review of Systems Positive as above Negative for pain, change in bowel habits, melena, hematochezia  Review of Systems General: Denies fevers/chills/weight loss unintentionally Cardiovascular: Denies chest pain Pulmonary: Denies shortness of breath Gastroenterological: See HPI Genitourinary: Denies darkened urine Hematological: Denies easy bruising/bleeding Dermatological: Denies jaundice Psychological: Mood is stable  Medications Current Outpatient Medications   Medication Sig Dispense Refill   apixaban (ELIQUIS) 5 MG TABS tablet Take 5 mg by mouth 2 (two) times daily.     ARIPiprazole (ABILIFY) 10 MG tablet Take 10 mg by mouth daily.     bisacodyl  (DULCOLAX) 5 MG EC tablet Take 5 mg by mouth as needed for moderate constipation.     divalproex  (DEPAKOTE ) 500 MG DR tablet Take 1,000 mg by mouth at bedtime.     metoprolol  succinate (TOPROL -XL) 50 MG 24 hr tablet Take 50 mg by mouth at bedtime.     QUEtiapine  (SEROQUEL ) 400 MG tablet Take 800 mg by mouth at bedtime.      rosuvastatin (CRESTOR) 5 MG tablet Take 5 mg by mouth daily.     No current facility-administered medications for this visit.    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/2019   Renal cyst    Urinary retention    After surgery   Past Surgical History:  Procedure Laterality Date   CHOLECYSTECTOMY N/A 12/25/2012   Procedure: LAPAROSCOPIC CHOLECYSTECTOMY WITH INTRAOPERATIVE CHOLANGIOGRAM;  Surgeon: Morene ONEIDA Olives, MD;  Location: [facility];  Service: General;  Laterality: N/A;   COLONOSCOPY     LASIK  2000   TOTAL SHOULDER ARTHROPLASTY Left 03/13/2019   Procedure: TOTAL SHOULDER ARTHROPLASTY;  Surgeon: Josefina Chew, MD;  Location: [facility];  Service: Orthopedics;  Laterality: Left;   Social History   Socioeconomic History   Marital status: Married    Spouse name: Not on file   Number of children:  3   Years of education: Not on file   Highest education level: Not on file  Occupational History   Occupation: retired  Tobacco Use   Smoking status: Never   Smokeless tobacco: Current    Types: Chew  Vaping Use   Vaping status: Never Used  Substance and Sexual Activity   Alcohol use: Yes    Alcohol/week: 1.0 standard drink of alcohol    Types: 1  Cans of beer per week    Comment: occasional   Drug use: No   Sexual activity: Yes  Other Topics Concern   Not on file  Social History Narrative   Not on file   Social Drivers of Health   Financial Resource Strain: Not on file  Food Insecurity: Not on file  Transportation Needs: Not on file  Physical Activity: Not on file  Stress: Not on file  Social Connections: Not on file  Intimate Partner Violence: Not on file   Family History  Problem Relation Age of Onset   AAA (abdominal aortic aneurysm) Father    Colon cancer Neg Hx    Esophageal cancer Neg Hx    Inflammatory bowel disease Neg Hx    Liver disease Neg Hx    Pancreatic cancer Neg Hx    Rectal cancer Neg Hx    Stomach cancer Neg Hx    I have reviewed his medical, social, and family history in detail and updated the electronic medical record as necessary.    PHYSICAL EXAMINATION  BP 110/80 (BP Location: Left Arm, Patient Position: Sitting, Cuff Size: Normal)   Pulse 72 Comment: irregular  Ht 5' 6 (1.676 m) Comment: height measured without shoes  Wt 178 lb 8 oz (81 kg)   BMI 28.81 kg/m  Wt Readings from Last 3 Encounters:  02/10/24 178 lb 8 oz (81 kg)  03/13/19 185 lb 8 oz (84.1 kg)  03/05/19 186 lb 1.6 oz (84.4 kg)  GEN: NAD, appears stated age, doesn't appear chronically ill PSYCH: Cooperative, without pressured speech EYE: Conjunctivae pink, sclerae anicteric ENT: MMM CV: Nontachycardic RESP: No audible wheezing GI: NABS, soft, NT/ND, without rebound MSK/EXT: No significant lower extremity edema SKIN: No jaundice NEURO:  Alert & Oriented x 3, no focal deficits   REVIEW OF DATA  I reviewed the following data at the time of this encounter:  GI Procedures and Studies  April 2025 colonoscopy One 3 mm polyp in the sigmoid colon, removed with hot snare. One 3 mm polyp at the hepatic flexure, removed with cold biopsy forceps. Rule out malignancy polypoid lesion in the cecum.  25 mm polypoid lesion found  in the cecum.  No bleeding present.  Biopsies were taken for histology. Internal hemorrhoids. Pathology consistent for cecal lesion as tubulovillous adenoma  Laboratory Studies  Reviewed those in epic  Imaging Studies  No relevant studies to review   ASSESSMENT  Mr. Wight is a 63 y.o. male with a pmh significant for hypertension, hyperlipidemia, arthritis, schizoaffective disorder, colon polyps (TA's and cecal/ao TVA still in situ), hemorrhoids.  The patient is seen today for evaluation and management of:  1. Cecal polyp   2. Tubulovillous adenoma of colon   3. Chronic anticoagulation   4. Hx of adenomatous colonic polyps    The patient is clinically and hemodynamically stable at this time.  Based upon the description and endoscopic pictures I do feel that it is reasonable to pursue an Advanced Polypectomy attempt of the polyp/lesion.  We discussed some of the  techniques of advanced polypectomy which include Endoscopic Mucosal Resection, OVESCO Full-Thickness Resection, Endorotor Morcellation, and Tissue Ablation via Fulguration.  If this lesion is truly involving the appendiceal orifice, then there is a higher chance for potentially needing FTRD.  There could be a higher risk of appendicitis and the resection attempt of this lesion.  If FTRD is needed the risk of appendicitis can be upwards of 25%.  We also reviewed images of typical techniques as noted above.  The risks and benefits of endoscopic evaluation were discussed with the patient; these include but are not limited to the risk of perforation, infection, bleeding, missed lesions, lack of diagnosis, severe illness requiring hospitalization, as well as anesthesia and sedation related illnesses.  During attempts at advanced resection, the risks of bleeding and perforation/leak are increased as opposed to diagnostic and screening procedures, and that was discussed with the patient as well.   In addition, I explained that with the possible  need for piecemeal resection, subsequent short-interval endoscopic evaluation for follow up and potential retreatment of the lesion/area may be necessary.  I did offer, a referral to surgery in order for patient to have opportunity to discuss surgical management/intervention prior to finalizing decision for attempt at endoscopic removal, however, the patient deferred on this.  If, after attempt at removal of the polyp/lesion, it is found that the patient has a complication or that an invasive lesion or malignant lesion is found, or that the polyp/lesion continues to recur, the patient is aware and understands that surgery may still be indicated/required.  I will need a CTAP to ensure that there is no evidence of a progressive appendiceal dilation where in which endoscopic approach at resection would not make sense and rather surgical approach should be considered upfront.  Will work on scheduling that.  I will hold on his procedure until he has had at least 3 months of anticoagulation to decrease his risk of issues of starting and stopping medications as he will need to hold anticoagulation for at least 1 to 2 days prior to procedure and potential up to 2 days after procedure depending on what we accomplish.  We will work with his provider about anticoagulation needs.  All patient questions were answered, to the best of my ability, and the patient agrees to the aforementioned plan of action with follow-up as indicated.   PLAN  Preprocedure labs to be obtained Will postpone colonoscopy currently scheduled until September, when it has been at least 3 months of anticoagulation therapy Proceed with ordering CTAP to ensure appendix is not involved where surgical resection should be considered upfront Follow-up to be dictated based on results of colonoscopy   Orders Placed This Encounter  Procedures   CBC   Basic Metabolic Panel (BMET)    New Prescriptions   No medications on file   Modified Medications    No medications on file    Planned Follow Up No follow-ups on file.   Total Time in Face-to-Face and in Coordination of Care for patient including independent/personal interpretation/review of prior testing, medical history, examination, medication adjustment, communicating results with the patient directly, and documentation within the EHR is 45 minutes.   Aloha Finner, MD [facility] Advanced [facility]oscopy Office # [phone]

## 2024-02-13 ENCOUNTER — Telehealth: Payer: Self-pay

## 2024-02-13 DIAGNOSIS — K635 Polyp of colon: Secondary | ICD-10-CM

## 2024-02-13 DIAGNOSIS — K861 Other chronic pancreatitis: Secondary | ICD-10-CM

## 2024-02-13 DIAGNOSIS — Z860101 Personal history of adenomatous and serrated colon polyps: Secondary | ICD-10-CM

## 2024-02-13 NOTE — Telephone Encounter (Signed)
-----   Message from [facility] sent at 02/11/2024  4:29 AM EDT ----- Regarding: Follow-up Steve Levy, As I was completing the note and thinking about the patient further I had discussed with him briefly the consideration of a CT scan to make sure that the appendix was not newly dilated or having issues where we would think about surgical interventions upfront rather than an endoscopic attempt at resection of the cecal/appendiceal orifice polyp. Please reach out to patient on Monday, and let him know that we do recommend a CTAP with IV and oral contrast at his convenience in the next few weeks so that we can make sure that the appendix is not dilated we are going to just direct him to surgery when anticoagulation can be held. Thanks. GM

## 2024-02-13 NOTE — Telephone Encounter (Signed)
 Order for CT scan has been placed. Patient has been informed. Patient will contacted by [facility]iology Scheduling to schedule CT scan. Patient is aware that if he has not heard from them within 2 days he needs to call [phone]. Patient voiced understanding.

## 2024-02-17 ENCOUNTER — Telehealth: Payer: Self-pay

## 2024-02-17 ENCOUNTER — Other Ambulatory Visit: Payer: Self-pay

## 2024-02-17 DIAGNOSIS — D126 Benign neoplasm of colon, unspecified: Secondary | ICD-10-CM

## 2024-02-17 DIAGNOSIS — K635 Polyp of colon: Secondary | ICD-10-CM

## 2024-02-17 DIAGNOSIS — Z860101 Personal history of adenomatous and serrated colon polyps: Secondary | ICD-10-CM

## 2024-02-17 NOTE — Telephone Encounter (Signed)
 Recd clearance back from [facility] okay for patient to hold Xarelto for 2 days prior to his procedure. Eliquis was switched to Xarelto due to cost. Patient has been informed and voiced understanding.

## 2024-05-04 ENCOUNTER — Encounter: Payer: Self-pay | Admitting: Gastroenterology

## 2024-05-07 ENCOUNTER — Telehealth: Payer: Self-pay | Admitting: Gastroenterology

## 2024-05-07 DIAGNOSIS — K635 Polyp of colon: Secondary | ICD-10-CM

## 2024-05-07 DIAGNOSIS — Z860101 Personal history of adenomatous and serrated colon polyps: Secondary | ICD-10-CM

## 2024-05-07 DIAGNOSIS — D126 Benign neoplasm of colon, unspecified: Secondary | ICD-10-CM

## 2024-05-07 DIAGNOSIS — Z860100 Personal history of colon polyps, unspecified: Secondary | ICD-10-CM

## 2024-05-07 DIAGNOSIS — Z7901 Long term (current) use of anticoagulants: Secondary | ICD-10-CM

## 2024-05-07 NOTE — Telephone Encounter (Signed)
 JSD, Thanks for looking into this. He is scheduled for colonoscopy next week. At this point, unless we can get approval for the CT scan prior to next week's colonoscopy, we do need to postpone his colonoscopy. Again, the reason is that we want to ensure that the appendix orifice has not already been infiltrated by this lesion where endoscopic approach and resection carries higher risk. I will put Rovonda on here so that she can let the patient know that we will need to work on rescheduling colonoscopy. As soon as we can get the appeal in, we can work on getting that CT scan done and then getting his colonoscopy attempt for resection.  Rovonda, Lets plan to push this patient's colonoscopy out for at least another month so that we have time to get the CT scan completed. Thanks. GM  VS, FYI we have to postpone your referred patient's procedure, because we really need to know that the appendix orifice is not involved or already dilated/containing mucocele that would tell us  that an endoscopic resection attempt is not worth pursuing. GM

## 2024-05-07 NOTE — Telephone Encounter (Signed)
 [employer] has denied this pt's CT scan. They had requested clinicals to be uploaded with two identifiers on each page which had been done. After a second call to check on status the case was denied with no further explanation and an appeal will need to be initiated with the health plan directly. I have contacted the pt to let him know and he wishes to cancel the CT scan until an approval is obtained. I will contact the healthplan to initiate an appeal.

## 2024-05-07 NOTE — Telephone Encounter (Signed)
 FYI

## 2024-05-08 NOTE — Progress Notes (Signed)
 Anesthesia Review:  PCP: Cardiologist :  PPM/ ICD: Device Orders: Rep Notified:  Chest x-ray : EKG : Echo : Stress test: Cardiac Cath :   Activity level:  Sleep Study/ CPAP : Fasting Blood Sugar :      / Checks Blood Sugar -- times a day:    Blood Thinner/ Instructions /Last Dose: ASA / Instructions/ Last Dose :    XARELTO    02/01/24- left leg DVT    05/08/2024- Called pt and pt states that insurance has declined his CT schedled for 05/09/2024.  Pt was under the understanding.  CT was to be done prior to colonoscopy and wonders if colonoscopy is still going to be done.  Instructed pt to call DR Mansouraty office to see if still going to be done.  PT voiced uinderstanding. Will monitor schedule to see if pt stays on schedule or is removed.

## 2024-05-09 ENCOUNTER — Ambulatory Visit (HOSPITAL_COMMUNITY)

## 2024-05-10 NOTE — Telephone Encounter (Signed)
 Patient has been r/s to 07/02/24 @ [facility] for his Endoscopies. All new instructions will be sent to the patient. Patient will be contacted to r/s CT scan once appeals has been done and approved.

## 2024-05-10 NOTE — Telephone Encounter (Signed)
 Patient calling in regards to previous note. Please advise.   Thank you

## 2024-05-10 NOTE — Telephone Encounter (Signed)
 Patient's procedure has been r/s to 07/02/24. Please advise when appeals has been done so that we can get patient r/s for CT prior to his colonoscopy in November.

## 2024-05-10 NOTE — Addendum Note (Signed)
 Addended byBETHA SUELLEN PEERS on: 05/10/2024 08:51 AM   Modules accepted: Orders

## 2024-05-16 NOTE — Telephone Encounter (Signed)
 Inbound call from patient insurance company trying to get in contact with the person who filed an appeal form. Erminio the rep with [employer] stated that does not see an urgency on this form she will will file the appeal form as standard. Brend stated that their is no direct contact number for her but we can [phone]. Please advise.

## 2024-05-30 NOTE — Telephone Encounter (Signed)
 Received fax request re: expedited appeal from [facility] and Appeal Dept requesting all related medical records to be faxed to [phone]. Inquiry Control # [id number], Specialist: Ethelene Ferries.  Ph: [phone] ext [phone]

## 2024-06-06 ENCOUNTER — Telehealth: Payer: Self-pay | Admitting: Gastroenterology

## 2024-06-06 NOTE — Telephone Encounter (Signed)
 Patient states he was advised his ct scan was approved. Please advise.   Thank you

## 2024-06-13 ENCOUNTER — Ambulatory Visit (HOSPITAL_COMMUNITY)
Admission: RE | Admit: 2024-06-13 | Discharge: 2024-06-13 | Disposition: A | Discharge status: Home / Self Care | Source: Ambulatory Visit | Attending: Gastroenterology | Admitting: Gastroenterology

## 2024-06-13 DIAGNOSIS — N400 Enlarged prostate without lower urinary tract symptoms: Secondary | ICD-10-CM | POA: Diagnosis not present

## 2024-06-13 DIAGNOSIS — K861 Other chronic pancreatitis: Secondary | ICD-10-CM | POA: Diagnosis not present

## 2024-06-13 DIAGNOSIS — R188 Other ascites: Secondary | ICD-10-CM | POA: Diagnosis not present

## 2024-06-13 DIAGNOSIS — Z860101 Personal history of adenomatous and serrated colon polyps: Secondary | ICD-10-CM | POA: Diagnosis not present

## 2024-06-13 DIAGNOSIS — K635 Polyp of colon: Secondary | ICD-10-CM | POA: Insufficient documentation

## 2024-06-13 DIAGNOSIS — K769 Liver disease, unspecified: Secondary | ICD-10-CM | POA: Diagnosis not present

## 2024-06-13 DIAGNOSIS — K4090 Unilateral inguinal hernia, without obstruction or gangrene, not specified as recurrent: Secondary | ICD-10-CM | POA: Diagnosis not present

## 2024-06-13 MED ORDER — IOHEXOL 300 MG/ML  SOLN
100.0000 mL | Freq: Once | INTRAMUSCULAR | Status: AC | PRN
  Administered 2024-06-13: 100 mL via INTRAVENOUS

## 2024-06-13 MED ORDER — SODIUM CHLORIDE (PF) 0.9 % IJ SOLN
INTRAMUSCULAR | Status: AC
  Filled 2024-06-13: qty 50

## 2024-06-19 ENCOUNTER — Ambulatory Visit: Payer: Self-pay | Admitting: Gastroenterology

## 2024-06-25 ENCOUNTER — Encounter (HOSPITAL_COMMUNITY): Payer: Self-pay | Admitting: Gastroenterology

## 2024-06-25 ENCOUNTER — Telehealth: Payer: Self-pay | Admitting: Gastroenterology

## 2024-06-25 NOTE — Progress Notes (Signed)
 Attempted to obtain medical history for pre op call via telephone, unable to reach at this time. HIPAA compliant voicemail message left requesting return call to pre surgical testing department.

## 2024-06-25 NOTE — Telephone Encounter (Addendum)
 Procedure:Colonoscopy Procedure date: 07/02/24 Procedure location: [facility] Arrival Time: 9:20 am Spoke with the patient Y/N: Yes Any prep concerns? No  Has the patient obtained the prep from the pharmacy ? Yes Do you have a care partner and transportation: Yes Any additional concerns? No

## 2024-06-30 NOTE — Anesthesia Preprocedure Evaluation (Signed)
 Anesthesia Evaluation  Patient identified by MRN, date of birth, ID band Patient awake    Reviewed: Allergy & Precautions, NPO status , Patient's Chart, lab work & pertinent test results  History of Anesthesia Complications Negative for: history of anesthetic complications  Airway Mallampati: III  TM Distance: >3 FB Neck ROM: Full    Dental no notable dental hx. (+) Teeth Intact, Dental Advisory Given   Pulmonary    Pulmonary exam normal breath sounds clear to auscultation       Cardiovascular hypertension, Pt. on medications and Pt. on home beta blockers (-) angina (-) Past MI Normal cardiovascular exam Rhythm:Regular Rate:Normal     Neuro/Psych  PSYCHIATRIC DISORDERS    Schizophrenia     GI/Hepatic negative GI ROS, Neg liver ROS,neg GERD  ,,  Endo/Other  neg diabetes    Renal/GU Renal disease     Musculoskeletal  (+) Arthritis ,    Abdominal   Peds  Hematology   Anesthesia Other Findings   Reproductive/Obstetrics                              Anesthesia Physical Anesthesia Plan  ASA: 2  Anesthesia Plan: MAC   Post-op Pain Management: Minimal or no pain anticipated   Induction: Intravenous  PONV Risk Score and Plan: 1 and Treatment may vary due to age or medical condition and Propofol  infusion  Airway Management Planned: Natural Airway and Nasal Cannula  Additional Equipment: None  Intra-op Plan:   Post-operative Plan:   Informed Consent: I have reviewed the patients History and Physical, chart, labs and discussed the procedure including the risks, benefits and alternatives for the proposed anesthesia with the patient or authorized representative who has indicated his/her understanding and acceptance.     Dental advisory given  Plan Discussed with: CRNA and Surgeon  Anesthesia Plan Comments:          Anesthesia Quick Evaluation

## 2024-07-02 ENCOUNTER — Other Ambulatory Visit: Payer: Self-pay

## 2024-07-02 ENCOUNTER — Encounter (HOSPITAL_COMMUNITY)
Admission: RE | Disposition: A | Discharge status: Home / Self Care | Payer: Self-pay | Source: Home / Self Care | Attending: Gastroenterology

## 2024-07-02 ENCOUNTER — Encounter (HOSPITAL_COMMUNITY): Payer: Self-pay | Admitting: Anesthesiology

## 2024-07-02 ENCOUNTER — Ambulatory Visit (HOSPITAL_COMMUNITY)
Admission: RE | Admit: 2024-07-02 | Discharge: 2024-07-02 | Disposition: A | Discharge status: Home / Self Care | Attending: Gastroenterology | Admitting: Gastroenterology

## 2024-07-02 ENCOUNTER — Ambulatory Visit (HOSPITAL_BASED_OUTPATIENT_CLINIC_OR_DEPARTMENT_OTHER): Payer: Self-pay | Admitting: Anesthesiology

## 2024-07-02 ENCOUNTER — Encounter (HOSPITAL_COMMUNITY): Payer: Self-pay | Admitting: Gastroenterology

## 2024-07-02 DIAGNOSIS — Z860100 Personal history of colon polyps, unspecified: Secondary | ICD-10-CM | POA: Diagnosis not present

## 2024-07-02 DIAGNOSIS — F259 Schizoaffective disorder, unspecified: Secondary | ICD-10-CM | POA: Diagnosis not present

## 2024-07-02 DIAGNOSIS — Z7901 Long term (current) use of anticoagulants: Secondary | ICD-10-CM | POA: Diagnosis not present

## 2024-07-02 DIAGNOSIS — D120 Benign neoplasm of cecum: Secondary | ICD-10-CM | POA: Diagnosis not present

## 2024-07-02 DIAGNOSIS — Z79899 Other long term (current) drug therapy: Secondary | ICD-10-CM | POA: Diagnosis not present

## 2024-07-02 DIAGNOSIS — K635 Polyp of colon: Secondary | ICD-10-CM | POA: Diagnosis present

## 2024-07-02 DIAGNOSIS — I10 Essential (primary) hypertension: Secondary | ICD-10-CM | POA: Diagnosis not present

## 2024-07-02 DIAGNOSIS — K641 Second degree hemorrhoids: Secondary | ICD-10-CM | POA: Diagnosis not present

## 2024-07-02 DIAGNOSIS — K562 Volvulus: Secondary | ICD-10-CM

## 2024-07-02 HISTORY — DX: Acute embolism and thrombosis of unspecified deep veins of unspecified lower extremity: I82.409

## 2024-07-02 SURGERY — COLONOSCOPY
Anesthesia: Monitor Anesthesia Care

## 2024-07-02 MED ORDER — PROPOFOL 500 MG/50ML IV EMUL
INTRAVENOUS | Status: DC | PRN
  Administered 2024-07-02: 140 ug/kg/min via INTRAVENOUS

## 2024-07-02 MED ORDER — SODIUM CHLORIDE 0.9 % IV SOLN: INTRAVENOUS | Status: DC | PRN

## 2024-07-02 MED ORDER — PROPOFOL 10 MG/ML IV BOLUS
INTRAVENOUS | Status: DC | PRN
Start: 2024-07-02 — End: 2024-07-02
  Administered 2024-07-02: 40 mg via INTRAVENOUS

## 2024-07-02 MED ORDER — PHENYLEPHRINE 80 MCG/ML (10ML) SYRINGE FOR IV PUSH (FOR BLOOD PRESSURE SUPPORT)
PREFILLED_SYRINGE | INTRAVENOUS | Status: DC | PRN
  Administered 2024-07-02 (×3): 160 ug via INTRAVENOUS

## 2024-07-02 MED ORDER — PROPOFOL 1000 MG/100ML IV EMUL
INTRAVENOUS | Status: AC
  Filled 2024-07-02: qty 100

## 2024-07-02 MED ORDER — SODIUM CHLORIDE 0.9 % IV SOLN: INTRAVENOUS | Status: DC

## 2024-07-02 NOTE — Anesthesia Postprocedure Evaluation (Signed)
 Anesthesia Post Note  Patient: Steve Levy  Procedure(s) Performed: COLONOSCOPY RESECTION, MUCOSAL LESION, GI TRACT, ENDOSCOPIC     Patient location during evaluation: Endoscopy Anesthesia Type: MAC Level of consciousness: awake and alert Pain management: pain level controlled Vital Signs Assessment: post-procedure vital signs reviewed and stable Respiratory status: spontaneous breathing, nonlabored ventilation, respiratory function stable and patient connected to nasal cannula oxygen Cardiovascular status: blood pressure returned to baseline and stable Postop Assessment: no apparent nausea or vomiting Anesthetic complications: no   No notable events documented.  Last Vitals:  Vitals:   07/02/24 1120 07/02/24 1130  BP: (!) 142/97 130/87  Pulse: 82 74  Resp: 15 14  Temp:    SpO2: 98% 98%    Last Pain:  Vitals:   07/02/24 1130  TempSrc:   PainSc: 1                  Garnette DELENA Gab

## 2024-07-02 NOTE — Op Note (Signed)
 [facility] Patient Name: Steve Levy Procedure Date: 07/02/2024 MRN: [id number] Attending MD: Aloha Finner , MD, [id number] Date of Birth: 1961/01/19 CSN: [id number] Age: 63 Admit Type: Outpatient Procedure:                Colonoscopy Indications:              Excision of colonic polyp Providers:                Aloha Finner, MD, Olam Riedel, RN, [facility]                            [place], Technician, Eva CROME, CRNA Referring MD:              Medicines:                Monitored Anesthesia Care Complications:            No immediate complications. Estimated Blood Loss:     Estimated blood loss was minimal. Procedure:                Pre-Anesthesia Assessment:                           - Prior to the procedure, a History and Physical                            was performed, and patient medications and                            allergies were reviewed. The patient's tolerance of                            previous anesthesia was also reviewed. The risks                            and benefits of the procedure and the sedation                            options and risks were discussed with the patient.                            All questions were answered, and informed consent                            was obtained. Prior Anticoagulants: The patient has                            taken no anticoagulant or antiplatelet agents. ASA                            Grade Assessment: II - A patient with mild systemic                            disease. After reviewing the risks and benefits,  the patient was deemed in satisfactory condition to                            undergo the procedure.                           After obtaining informed consent, the colonoscope                            was passed under direct vision. Throughout the                            procedure, the patient's blood pressure, pulse, and                             oxygen saturations were monitored continuously. The                            CF-HQ190L ([id number]) Olympus colonoscope was                            introduced through the anus and advanced to the 3                            cm into the ileum. The colonoscopy was performed                            without difficulty. The patient tolerated the                            procedure. The quality of the bowel preparation was                            adequate. The terminal ileum, ileocecal valve,                            appendiceal orifice, and rectum were photographed. Scope In: 10:34:42 AM Scope Out: 11:06:04 AM Scope Withdrawal Time: 0 hours 24 minutes 54 seconds  Total Procedure Duration: 0 hours 31 minutes 22 seconds  Findings:      The digital rectal exam findings include hemorrhoids. Pertinent       negatives include no palpable rectal lesions.      The colon (entire examined portion) revealed significantly excessive       looping.      The terminal ileum and ileocecal valve appeared normal.      A 3 mm polyp was found in the cecum. The polyp was sessile. The polyp       was removed with a cold snare. Resection and retrieval were complete.      A 25 mm polyp was found in the cecum. The polyp was semi-sessile.       Preparations were made for mucosal resection. Demarcation of the lesion       was performed with high-definition white light and narrow band imaging       to clearly identify the boundaries of the lesion. EverLift was injected  to raise the lesion. Snare mucosal resection was performed. Resection       and retrieval were complete. Resected tissue margins were examined and       clear of polyp tissue. Coagulation for tissue destruction using snare       was successful. To prevent bleeding after mucosal resection, four       hemostatic clips were successfully placed (MR conditional). Clip       manufacturer: [employer]. There was no bleeding during, or  at the       end, of the procedure.      Normal mucosa was found in the entire colon otherwise.      Non-bleeding non-thrombosed internal hemorrhoids were found during       retroflexion, during perianal exam and during digital exam. The       hemorrhoids were Grade II (internal hemorrhoids that prolapse but reduce       spontaneously). Impression:               - Hemorrhoids found on digital rectal exam.                           - There was significant looping of the colon.                           - The examined portion of the ileum was normal.                           - One 3 mm polyp in the cecum, removed with a cold                            snare. Resected and retrieved.                           - One 25 mm polyp in the cecum, removed with                            mucosal resection. Resected and retrieved. Treated                            with a hot snare. Clips (MR conditional) were                            placed. Clip manufacturer: [employer].                           - Normal mucosa in the entire examined colon                            otherwise.                           - Non-bleeding non-thrombosed internal hemorrhoids. Moderate Sedation:      Not Applicable - Patient had care per Anesthesia. Recommendation:           - The patient will be observed post-procedure,  until all discharge criteria are met.                           - Discharge patient to home.                           - Patient has a contact number available for                            emergencies. The signs and symptoms of potential                            delayed complications were discussed with the                            patient. Return to normal activities tomorrow.                            Written discharge instructions were provided to the                            patient.                           - High fiber diet.                            - Use FiberCon 1-2 tablets PO daily.                           - Minimize all NSAID medications (Aleve, Motrin,                            Ibuprofen, BC/Goody Powders, Naproxen) as able.                           - May restart Xarelto on 11/12 AM to decrease risk                            of post-interventional bleeding.                           - Continue present medications.                           - Await pathology results.                           - Repeat colonoscopy in 6-9 months for surveillance                            after piecemeal polyp resection (unless HGD is                            noted).                           -  The findings and recommendations were discussed                            with the patient.                           - The findings and recommendations were discussed                            with the patient's family. Procedure Code(s):        --- Professional ---                           [phone], Colonoscopy, flexible; with endoscopic                            mucosal resection                           [id number], 59, Colonoscopy, flexible; with removal of                            tumor(s), polyp(s), or other lesion(s) by snare                            technique Diagnosis Code(s):        --- Professional ---                           K64.1, Second degree hemorrhoids                           D12.0, Benign neoplasm of cecum                           K63.5, Polyp of colon CPT copyright 2022 [employer]. All rights reserved. The codes documented in this report are preliminary and upon coder review may  be revised to meet current compliance requirements. Aloha Finner, MD 07/02/2024 11:18:19 AM Number of Addenda: 0

## 2024-07-02 NOTE — Discharge Instructions (Signed)

## 2024-07-02 NOTE — Transfer of Care (Signed)
 Immediate Anesthesia Transfer of Care Note  Patient: Steve Levy  Procedure(s) Performed: COLONOSCOPY RESECTION, MUCOSAL LESION, GI TRACT, ENDOSCOPIC  Patient Location: PACU  Anesthesia Type:General  Level of Consciousness: drowsy  Airway & Oxygen Therapy: Patient Spontanous Breathing and Patient connected to face mask oxygen  Post-op Assessment: Report given to RN and Post -op Vital signs reviewed and stable  Post vital signs: Reviewed and stable  Last Vitals:  Vitals Value Taken Time  BP 106/61 07/02/24 11:12  Temp 36.3 C 07/02/24 11:12  Pulse 73 07/02/24 11:14  Resp 20 07/02/24 11:15  SpO2 100 % 07/02/24 11:14  Vitals shown include unfiled device data.  Last Pain:  Vitals:   07/02/24 1112  TempSrc: Temporal  PainSc: 0-No pain         Complications: No notable events documented.

## 2024-07-02 NOTE — H&P (Signed)
 GASTROENTEROLOGY PROCEDURE H&P NOTE   Primary Care Physician: Montey Lot, PA-C  HPI: KARSEN NAKANISHI is a 63 y.o. male who presents for Colonoscopy for attempt at polyp removal.  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   Renal cyst    Urinary retention    After surgery   Past Surgical History:  Procedure Laterality Date   CHOLECYSTECTOMY N/A 12/25/2012   Procedure: LAPAROSCOPIC CHOLECYSTECTOMY WITH INTRAOPERATIVE CHOLANGIOGRAM;  Surgeon: Morene ONEIDA Olives, MD;  Location: [facility];  Service: General;  Laterality: N/A;   COLONOSCOPY     LASIK  2000   TOTAL SHOULDER ARTHROPLASTY Left 03/13/2019   Procedure: TOTAL SHOULDER ARTHROPLASTY;  Surgeon: Josefina Chew, MD;  Location: [facility];  Service: Orthopedics;  Laterality: Left;   Current Facility-Administered Medications  Medication Dose Route Frequency Provider Last Rate Last Admin   0.9 %  sodium chloride  infusion   Intravenous Continuous Mansouraty, Aloha Raddle., MD        Current Facility-Administered Medications:    0.9 %  sodium chloride  infusion, , Intravenous, Continuous, Mansouraty, Aloha Raddle., MD No Known Allergies Family History  Problem Relation Age of Onset   AAA (abdominal aortic aneurysm) Father    Colon cancer Neg Hx    Esophageal cancer Neg Hx    Inflammatory bowel disease Neg Hx    Liver disease Neg Hx    Pancreatic cancer Neg Hx    Rectal cancer Neg Hx    Stomach cancer Neg Hx    Social History   Socioeconomic History   Marital status: Married    Spouse name: Not on file   Number of children: 3   Years of education: Not on file   Highest education level: Not on file  Occupational History   Occupation: retired  Tobacco Use   Smoking status: Never   Smokeless  tobacco: Current    Types: Chew  Vaping Use   Vaping status: Never Used  Substance and Sexual Activity   Alcohol use: Yes    Alcohol/week: 1.0 standard drink of alcohol    Types: 1 Cans of beer per week    Comment: occasional   Drug use: No   Sexual activity: Yes  Other Topics Concern   Not on file  Social History Narrative   Not on file   Social Drivers of Health   Financial Resource Strain: Not on file  Food Insecurity: Not on file  Transportation Needs: Not on file  Physical Activity: Not on file  Stress: Not on file  Social Connections: Not on file  Intimate Partner Violence: Not on file    Physical Exam: Today's Vitals   07/02/24 0938  BP: (!) 170/99  Pulse: 85  Resp: 20  Temp: (!) 97.4 F (36.3 C)  TempSrc: Temporal  SpO2: 99%  Weight: 79.4 kg  Height: 5' 8 (1.727 m)  PainSc: 0-No pain   Body mass index is 26.61 kg/m. GEN: NAD EYE: Sclerae anicteric ENT: MMM CV: Non-tachycardic GI: Soft, NT/ND NEURO:  Alert & Oriented x 3  Lab Results: No results for input(s): WBC, HGB, HCT, PLT in the last 72 hours. BMET No results for input(s): NA, K, CL, CO2, GLUCOSE, BUN, CREATININE, CALCIUM in the last 72 hours. LFT No results  for input(s): PROT, ALBUMIN , AST, ALT, ALKPHOS, BILITOT, BILIDIR, IBILI in the last 72 hours. PT/INR No results for input(s): LABPROT, INR in the last 72 hours.   Impression / Plan: This is a 63 y.o.male who presents for Colonoscopy for attempt at polyp removal.  The risks and benefits of endoscopic evaluation/treatment were discussed with the patient and/or family; these include but are not limited to the risk of perforation, infection, bleeding, missed lesions, lack of diagnosis, severe illness requiring hospitalization, as well as anesthesia and sedation related illnesses.  The patient's history has been reviewed, patient examined, no change in status, and deemed stable for procedure.  The  patient and/or family was provided an opportunity to ask questions and all were answered.  The patient and/or family is agreeable to proceed.    Aloha Finner, MD [facility] Office # [phone]

## 2024-07-03 ENCOUNTER — Ambulatory Visit: Payer: Self-pay | Admitting: Gastroenterology

## 2024-07-03 LAB — SURGICAL PATHOLOGY

## 2024-07-04 ENCOUNTER — Encounter (HOSPITAL_COMMUNITY): Payer: Self-pay | Admitting: Gastroenterology

## 2024-08-02 DIAGNOSIS — F250 Schizoaffective disorder, bipolar type: Secondary | ICD-10-CM | POA: Diagnosis not present
